# Patient Record
Sex: Female | Born: 1965 | Race: White | Hispanic: No | Marital: Married | Smoking: Never smoker
Health system: Southern US, Community
[De-identification: ages and names within clinical notes are randomized; demographics above are authoritative.]

## PROBLEM LIST (undated history)

## (undated) DIAGNOSIS — A879 Viral meningitis, unspecified: Secondary | ICD-10-CM

## (undated) DIAGNOSIS — J45909 Unspecified asthma, uncomplicated: Secondary | ICD-10-CM

## (undated) HISTORY — DX: Unspecified asthma, uncomplicated: J45.909

## (undated) HISTORY — PX: DILATION AND CURETTAGE OF UTERUS: SHX78

## (undated) HISTORY — DX: Viral meningitis, unspecified: A87.9

---
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
## 1987-07-05 DIAGNOSIS — A879 Viral meningitis, unspecified: Secondary | ICD-10-CM

## 1987-07-05 HISTORY — DX: Viral meningitis, unspecified: A87.9

## 2008-06-05 ENCOUNTER — Ambulatory Visit (HOSPITAL_COMMUNITY): Admission: RE | Admit: 2008-06-05 | Discharge: 2008-06-05 | Payer: Self-pay | Admitting: Family Medicine

## 2010-06-22 ENCOUNTER — Ambulatory Visit (HOSPITAL_COMMUNITY)
Admission: RE | Admit: 2010-06-22 | Discharge: 2010-06-22 | Payer: Self-pay | Source: Home / Self Care | Attending: Family Medicine | Admitting: Family Medicine

## 2018-08-20 ENCOUNTER — Ambulatory Visit
Admission: EM | Admit: 2018-08-20 | Discharge: 2018-08-20 | Disposition: A | Discharge status: Home / Self Care | Payer: Commercial Managed Care - PPO | Attending: Family Medicine | Admitting: Family Medicine

## 2018-08-20 ENCOUNTER — Encounter: Payer: Self-pay | Admitting: Emergency Medicine

## 2018-08-20 DIAGNOSIS — J209 Acute bronchitis, unspecified: Secondary | ICD-10-CM | POA: Diagnosis not present

## 2018-08-20 MED ORDER — BENZONATATE 200 MG PO CAPS: 200.0000 mg | ORAL_CAPSULE | Freq: Three times a day (TID) | ORAL | 0 refills | Status: AC | PRN

## 2018-08-20 MED ORDER — CETIRIZINE HCL 10 MG PO CAPS: 10.0000 mg | ORAL_CAPSULE | Freq: Every day | ORAL | 0 refills | Status: DC

## 2018-08-20 MED ORDER — HYDROCODONE-HOMATROPINE 5-1.5 MG/5ML PO SYRP: 5.0000 mL | ORAL_SOLUTION | Freq: Every day | ORAL | 0 refills | Status: DC

## 2018-08-20 MED ORDER — PREDNISONE 50 MG PO TABS: ORAL_TABLET | ORAL | 0 refills | Status: DC

## 2018-08-20 NOTE — ED Notes (Signed)
Patient able to ambulate independently  

## 2018-08-20 NOTE — Discharge Instructions (Signed)
You likely having a viral upper respiratory infection/bronchitis. We recommended symptom control. I expect your symptoms to start improving in the next 1-2 weeks.  -May fill prednisone if developing significant wheezing and shortness of breath, we will hold off on this for now -Albuterol inhaler as needed for shortness of breath and wheezing  1. Take a daily allergy pill/anti-histamine like Zyrtec, Claritin, or Store brand consistently for 2 weeks  2. For congestion you may try an oral decongestant like Mucinex or sudafed. You may also try intranasal flonase nasal spray or saline irrigations (neti pot, sinus cleanse)  3. For your sore throat you may try cepacol lozenges, salt water gargles, throat spray. Treatment of congestion may also help your sore throat.  4. For cough you may try Hycodan for cough at night time, Tessalon for cough during the day, Delsym, Robitussen, Mucinex DM  5. Take Tylenol or Ibuprofen to help with pain/inflammation  6. Stay hydrated, drink plenty of fluids to keep throat coated and less irritated  Honey Tea For cough/sore throat try using a honey-based tea. Use 3 teaspoons of honey with juice squeezed from half lemon. Place shaved pieces of ginger into 1/2-1 cup of water and warm over stove top. Then mix the ingredients and repeat every 4 hours as needed.

## 2018-08-20 NOTE — ED Triage Notes (Signed)
Pt presents to [facility] for assessment of 5 days of strong productive cough, congestion, headaches, fevers, increasing malaise.

## 2018-08-20 NOTE — ED Provider Notes (Signed)
[facility]    CSN: [id number] Arrival date & time: 08/20/18  1105     History   Chief Complaint Chief Complaint  Patient presents with  . Flu-Like Symptoms    HPI Lindsey Simon is a 53 y.o. female no significant past medical history presenting today for evaluation of URI symptoms and cough.  Patient states that she has had cough, congestion, headaches, fevers.  Fevers have been subjective.  Symptoms have been going on for approximately 5 days.  Her main concern is the cough.  Has noticed some wheezing.  Notes symptoms worse in the morning and evening, but improve during the day.  She has tried NyQuil and elderberry with minimal relief.  She is also noticed some neck discomfort and soreness related to coughing.  She denies chest pain.  HPI  History reviewed. No pertinent past medical history.  There are no active problems to display for this patient.   History reviewed. No pertinent surgical history.  OB History   No obstetric history on file.      Home Medications    Prior to Admission medications   Medication Sig Start Date End Date Taking? Authorizing Provider  Multiple Vitamin (MULTIVITAMIN) tablet Take 1 tablet by mouth daily.   Yes [provider]  benzonatate (TESSALON) 200 MG capsule Take 1 capsule (200 mg total) by mouth 3 (three) times daily as needed for up to 7 days for cough (during the day). 08/20/18 08/27/18  ,  C, PA-C  Cetirizine HCl 10 MG CAPS Take 1 capsule (10 mg total) by mouth daily for 10 days. 08/20/18 08/30/18  ,  C, PA-C  HYDROcodone-homatropine (HYCODAN) 5-1.5 MG/5ML syrup Take 5 mLs by mouth at bedtime. 08/20/18   ,  C, PA-C  predniSONE (DELTASONE) 50 MG tablet Take daily for 5 days with food in the morning 08/20/18   , Junius Creamer C, PA-C    Family History History reviewed. No pertinent family history.  Social History Social History   Tobacco Use  . Smoking status: Never Smoker   . Smokeless tobacco: Never Used  Substance Use Topics  . Alcohol use: Not Currently  . Drug use: Never     Allergies   Prednisone   Review of Systems Review of Systems  Constitutional: Negative for activity change, appetite change, chills, fatigue and fever.  HENT: Positive for congestion and rhinorrhea. Negative for ear pain, sinus pressure, sore throat and trouble swallowing.   Eyes: Negative for discharge and redness.  Respiratory: Positive for cough, shortness of breath and wheezing. Negative for chest tightness.   Cardiovascular: Negative for chest pain.  Gastrointestinal: Negative for abdominal pain, diarrhea, nausea and vomiting.  Musculoskeletal: Negative for myalgias.  Skin: Negative for rash.  Neurological: Positive for headaches. Negative for dizziness and light-headedness.     Physical Exam Triage Vital Signs ED Triage Vitals  Enc Vitals Group     BP 08/20/18 1116 (!) 145/108     Pulse Rate 08/20/18 1116 (!) 105     Resp 08/20/18 1116 18     Temp 08/20/18 1116 99.3 F (37.4 C)     Temp Source 08/20/18 1116 Oral     SpO2 08/20/18 1116 95 %     Weight --      Height --      Head Circumference --      Peak Flow --      Pain Score 08/20/18 1117 4     Pain Loc --  Pain Edu? --      Excl. in GC? --    No data found.  Updated Vital Signs BP (!) 145/108 (BP Location: Left Arm)   Pulse (!) 105   Temp 99.3 F (37.4 C) (Oral)   Resp 18   LMP 08/06/2018   SpO2 95%   Visual Acuity Right Eye Distance:   Left Eye Distance:   Bilateral Distance:    Right Eye Near:   Left Eye Near:    Bilateral Near:     Physical Exam Vitals signs and nursing note reviewed.  Constitutional:      General: She is not in acute distress.    Appearance: She is well-developed.  HENT:     Head: Normocephalic and atraumatic.     Ears:     Comments: Bilateral ears without tenderness to palpation of external auricle, tragus and mastoid, EAC's without erythema or  swelling, TM's with good bony landmarks and cone of light. Non erythematous.    Mouth/Throat:     Comments: Oral mucosa pink and moist, no tonsillar enlargement or exudate. Posterior pharynx patent and nonerythematous, no uvula deviation or swelling. Normal phonation. Eyes:     Conjunctiva/sclera: Conjunctivae normal.  Neck:     Musculoskeletal: Neck supple.  Cardiovascular:     Rate and Rhythm: Normal rate and regular rhythm.     Heart sounds: No murmur.  Pulmonary:     Effort: Pulmonary effort is normal. No respiratory distress.     Breath sounds: Normal breath sounds.     Comments: Breathing comfortably at rest, CTABL, no wheezing, rales or other adventitious sounds auscultated Wheezing with bronchospasm/cough, coughing with deep inspiration Abdominal:     Palpations: Abdomen is soft.     Tenderness: There is no abdominal tenderness.  Skin:    General: Skin is warm and dry.  Neurological:     Mental Status: She is alert.      UC Treatments / Results  Labs (all labs ordered are listed, but only abnormal results are displayed) Labs Reviewed - No data to display  EKG None  Radiology No results found.  Procedures Procedures (including critical care time)  Medications Ordered in UC Medications - No data to display  Initial Impression / Assessment and Plan / UC Course  I have reviewed the triage vital signs and the nursing notes.  Pertinent labs & imaging results that were available during my care of the patient were reviewed by me and considered in my medical decision making (see chart for details).    Patient with viral URI and possibly underlying bronchitis.  Recommend symptomatic and supportive care, provided Hycodan to use at bedtime to help with nighttime cough and rest.  Tessalon during the day.  Discussed course of prednisone, patient states that previously it is made her have insomnia, will avoid this at this time given lungs relatively clear.  Did provide  prescription to fill if developing increased wheezing and inflammation in chest.  Albuterol inhaler as needed, patient had at home already.  Continue to monitor,Discussed strict return precautions. Patient verbalized understanding and is agreeable with plan.  Final Clinical Impressions(s) / UC Diagnoses   Final diagnoses:  Acute bronchitis, unspecified organism     Discharge Instructions     You likely having a viral upper respiratory infection/bronchitis. We recommended symptom control. I expect your symptoms to start improving in the next 1-2 weeks.  -May fill prednisone if developing significant wheezing and shortness of breath, we will hold off  on this for now -Albuterol inhaler as needed for shortness of breath and wheezing  1. Take a daily allergy pill/anti-histamine like Zyrtec, Claritin, or Store brand consistently for 2 weeks  2. For congestion you may try an oral decongestant like Mucinex or sudafed. You may also try intranasal flonase nasal spray or saline irrigations (neti pot, sinus cleanse)  3. For your sore throat you may try cepacol lozenges, salt water gargles, throat spray. Treatment of congestion may also help your sore throat.  4. For cough you may try Hycodan for cough at night time, Tessalon for cough during the day, Delsym, Robitussen, Mucinex DM  5. Take Tylenol or Ibuprofen to help with pain/inflammation  6. Stay hydrated, drink plenty of fluids to keep throat coated and less irritated  Honey Tea For cough/sore throat try using a honey-based tea. Use 3 teaspoons of honey with juice squeezed from half lemon. Place shaved pieces of ginger into 1/2-1 cup of water and warm over stove top. Then mix the ingredients and repeat every 4 hours as needed.   ED Prescriptions    Medication Sig Dispense Auth. Provider   predniSONE (DELTASONE) 50 MG tablet Take daily for 5 days with food in the morning 5 tablet ,  C, PA-C   Cetirizine HCl 10 MG CAPS Take 1  capsule (10 mg total) by mouth daily for 10 days. 10 capsule ,  C, PA-C   HYDROcodone-homatropine (HYCODAN) 5-1.5 MG/5ML syrup Take 5 mLs by mouth at bedtime. 60 mL ,  C, PA-C   benzonatate (TESSALON) 200 MG capsule Take 1 capsule (200 mg total) by mouth 3 (three) times daily as needed for up to 7 days for cough (during the day). 28 capsule ,  C, PA-C     Controlled Substance Prescriptions  Controlled Substance Registry consulted? No   Lew Dawes, [place] 08/20/18 1325

## 2018-09-13 LAB — LIPID PANEL
CHOLESTEROL: 192 (ref 0–200)
HDL: 29 — AB (ref 35–70)
LDL Cholesterol: 92
TRIGLYCERIDES: 354 — AB (ref 40–160)

## 2018-09-13 LAB — BASIC METABOLIC PANEL: GLUCOSE: 210

## 2018-09-25 ENCOUNTER — Telehealth: Payer: Self-pay

## 2018-09-25 NOTE — Telephone Encounter (Signed)
Spoke with the patient. Appointment has been scheduled for a WebEx visit due to attempts to limit possible Covid_19 exposure.

## 2018-09-30 NOTE — Progress Notes (Signed)
Virtual Visit via Video Note  I connected with@ on 10/01/18 at 10:00 AM EDT by a video enabled telemedicine application and verified that I am speaking with the correct person using two identifiers.  Location patient: home Location provider:work or home office Persons participating in the virtual visit: patient, provider  I discussed the limitations of evaluation and management by telemedicine and the availability of in person appointments. The patient expressed understanding and agreed to proceed.   Lindsey Simon DOB: Feb 11, 1966 Encounter date: 10/01/2018  This is a 53 y.o. female who presents to establish care. Chief Complaint  Patient presents with  . New Patient (Initial Visit)    History of present illness: Works for [employer] and has biometric screenings. Blood pressure reading came through as high on her screening.  Highly stressful job. 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 BP has been creeping over the years. Exercise more focused on the weekends than on week days. Has been doing outdoor yard work. Does a lot of farming/gardening/demo type work. Just bought new property in [place].   Essential tremor - dx at age 69. All below neck. Knows how to manage it. Closing eyes makes it worse; opening them makes it better. Sugar, caffeine make it worse. Healthy eating improves.   Asthma: has been able to ward off colds with nebulizer tx. Does feel like infections tend to settle in chest.   Pickled beets when she has heart burn.   Does not have OBGYN; last primary was obgyn. Last pap she thinks was 5 years ago. No hx of abnormal paps.   Has home bp cuff but hasn't been checking regularly. Plans to start. Thinks usually systolic is in the 140's; unsure what diastolic usually runs.   Past Medical History:  Diagnosis Date  . Asthma   . Viral meningitis 1989   Past Surgical History:  Procedure Laterality Date  . DILATION AND CURETTAGE OF UTERUS    .  TONSILLECTOMY AND ADENOIDECTOMY  1982   Allergies  Allergen Reactions  . Calamine   . Prednisone Other (See Comments)    Tachycardia   Current Meds  Medication Sig  . Multiple Vitamin (MULTIVITAMIN) tablet Take 1 tablet by mouth daily.  . [DISCONTINUED] HYDROcodone-homatropine (HYCODAN) 5-1.5 MG/5ML syrup Take 5 mLs by mouth at bedtime.  . [DISCONTINUED] predniSONE (DELTASONE) 50 MG tablet Take daily for 5 days with food in the morning   Social History   Tobacco Use  . Smoking status: Never Smoker  . Smokeless tobacco: Never Used  Substance Use Topics  . Alcohol use: Yes    Comment: social, occasional   Family History  Adopted: Yes  Problem Relation Age of Onset  . Kidney disease Mother        when younger  . Other Father        essential tremor  . Heart disease Maternal Grandmother   . Other Maternal Grandfather 19       no significant medical issues     Review of Systems  Constitutional: Negative for activity change and fever.  HENT: Positive for congestion (seasonal allergies).   Respiratory: Negative for cough and shortness of breath.   Cardiovascular:       Blood pressure has been creeping up over the years.  Gastrointestinal: Negative for anal bleeding and blood in stool.  Endocrine:       Feels that she is having low blood sugars randomly; has to snack throughout day.   Psychiatric/Behavioral: Nervous/anxious: increased  stress at work.     Objective:  BP (!) 148/98   Ht 5\' 6"  (1.676 m)   Wt 253 lb (114.8 kg)   BMI 40.84 kg/m   Weight: 253 lb (114.8 kg)   BP Readings from Last 3 Encounters:  10/01/18 (!) 148/98  08/20/18 (!) 145/108   Wt Readings from Last 3 Encounters:  10/01/18 253 lb (114.8 kg)    EXAM: Although we had a connection via audio, the video was not working.  GENERAL: alert, oriented, appears well and in no acute distress  LUNGS: No obvious shortness of breath  MS: moves all visible extremities without noticeable  abnormality  PSYCH/NEURO: pleasant and cooperative, no obvious depression or anxiety, speech and thought processing grossly intact  Assessment/Plan  1. Hypertension, unspecified type We discussed possible need for blood pressure treatment with medications.  She is going to monitor her blood pressure with her home cuff and let me know how readings at home left over the next couple of days.  We will touch base with her when we get blood work results to see how home numbers are looking.  She has some interest in starting a beta-blocker for blood pressure control as this may also benefit her essential tremor.  There have been discussions in the past about using this for better control of her essential tremor, but she has not wanted to previously be on medications if possible.  We also discussed limiting salt intake in the diet, which may make a difference for blood pressure.  She is in the habit of drinking pickled beet juice which we discussed may be significantly elevating her blood pressure. - Comprehensive metabolic panel; Future - CBC with Differential/Platelet; Future  2. Essential tremor This is stable and does not bother her too much.  However, if she is started on blood pressure medication she would like to try a beta-blocker for better control of tremor.  3. Hyperlipidemia, unspecified hyperlipidemia type We discussed working on diet to help with cholesterol. - TSH; Future  4. Elevated fasting glucose - Hemoglobin A1c; Future  5. Mild intermittent asthma, unspecified whether complicated Well controlled off of all medications currently.  - albuterol (PROVENTIL HFA;VENTOLIN HFA) 108 (90 Base) MCG/ACT inhaler; Inhale 2 puffs into the lungs every 6 (six) hours as needed for wheezing or shortness of breath.  Dispense: 1 Inhaler; Refill: 0  6. Screening for colon cancer - Ambulatory referral to Gastroenterology  7. Screening for breast cancer - MM DIGITAL SCREENING BILATERAL;  Future     She is due for Tdap, pap, mammogram (ordered), colonoscopy (ordered)  I discussed the assessment and treatment plan with the patient. The patient was provided an opportunity to ask questions and all were answered. The patient agreed with the plan and demonstrated an understanding of the instructions.   The patient was advised to call back or seek an in-person evaluation if the symptoms worsen or if the condition fails to improve as anticipated.    Theodis Shove, MD

## 2018-10-01 ENCOUNTER — Other Ambulatory Visit: Payer: Self-pay

## 2018-10-01 ENCOUNTER — Ambulatory Visit (INDEPENDENT_AMBULATORY_CARE_PROVIDER_SITE_OTHER): Payer: Commercial Managed Care - PPO | Admitting: Family Medicine

## 2018-10-01 ENCOUNTER — Encounter: Payer: Self-pay | Admitting: Family Medicine

## 2018-10-01 ENCOUNTER — Ambulatory Visit: Payer: Commercial Managed Care - PPO | Admitting: Family Medicine

## 2018-10-01 VITALS — BP 148/98 | Ht 66.0 in | Wt 253.0 lb

## 2018-10-01 DIAGNOSIS — I10 Essential (primary) hypertension: Secondary | ICD-10-CM

## 2018-10-01 DIAGNOSIS — E1165 Type 2 diabetes mellitus with hyperglycemia: Secondary | ICD-10-CM | POA: Insufficient documentation

## 2018-10-01 DIAGNOSIS — IMO0002 Reserved for concepts with insufficient information to code with codable children: Secondary | ICD-10-CM | POA: Insufficient documentation

## 2018-10-01 DIAGNOSIS — G250 Essential tremor: Secondary | ICD-10-CM

## 2018-10-01 DIAGNOSIS — E785 Hyperlipidemia, unspecified: Secondary | ICD-10-CM

## 2018-10-01 DIAGNOSIS — Z1211 Encounter for screening for malignant neoplasm of colon: Secondary | ICD-10-CM

## 2018-10-01 DIAGNOSIS — R7301 Impaired fasting glucose: Secondary | ICD-10-CM

## 2018-10-01 DIAGNOSIS — J4520 Mild intermittent asthma, uncomplicated: Secondary | ICD-10-CM

## 2018-10-01 DIAGNOSIS — Z1239 Encounter for other screening for malignant neoplasm of breast: Secondary | ICD-10-CM

## 2018-10-01 DIAGNOSIS — J45909 Unspecified asthma, uncomplicated: Secondary | ICD-10-CM | POA: Insufficient documentation

## 2018-10-01 MED ORDER — ALBUTEROL SULFATE HFA 108 (90 BASE) MCG/ACT IN AERS: 2.0000 | INHALATION_SPRAY | Freq: Four times a day (QID) | RESPIRATORY_TRACT | 0 refills | Status: AC | PRN

## 2018-10-02 ENCOUNTER — Other Ambulatory Visit (INDEPENDENT_AMBULATORY_CARE_PROVIDER_SITE_OTHER): Payer: Commercial Managed Care - PPO

## 2018-10-02 ENCOUNTER — Other Ambulatory Visit: Payer: Self-pay

## 2018-10-02 DIAGNOSIS — E785 Hyperlipidemia, unspecified: Secondary | ICD-10-CM

## 2018-10-02 DIAGNOSIS — I10 Essential (primary) hypertension: Secondary | ICD-10-CM

## 2018-10-02 DIAGNOSIS — R7301 Impaired fasting glucose: Secondary | ICD-10-CM | POA: Diagnosis not present

## 2018-10-02 LAB — CBC WITH DIFFERENTIAL/PLATELET
BASOS PCT: 0.5 % (ref 0.0–3.0)
Basophils Absolute: 0 10*3/uL (ref 0.0–0.1)
EOS ABS: 0.1 10*3/uL (ref 0.0–0.7)
Eosinophils Relative: 1.8 % (ref 0.0–5.0)
HEMATOCRIT: 46.8 % — AB (ref 36.0–46.0)
HEMOGLOBIN: 16.1 g/dL — AB (ref 12.0–15.0)
LYMPHS PCT: 32.7 % (ref 12.0–46.0)
Lymphs Abs: 2.2 10*3/uL (ref 0.7–4.0)
MCHC: 34.4 g/dL (ref 30.0–36.0)
MCV: 88 fl (ref 78.0–100.0)
Monocytes Absolute: 0.4 10*3/uL (ref 0.1–1.0)
Monocytes Relative: 6.5 % (ref 3.0–12.0)
Neutro Abs: 4 10*3/uL (ref 1.4–7.7)
Neutrophils Relative %: 58.5 % (ref 43.0–77.0)
Platelets: 213 10*3/uL (ref 150.0–400.0)
RBC: 5.32 Mil/uL — AB (ref 3.87–5.11)
RDW: 13.7 % (ref 11.5–15.5)
WBC: 6.8 10*3/uL (ref 4.0–10.5)

## 2018-10-02 LAB — COMPREHENSIVE METABOLIC PANEL
ALBUMIN: 4 g/dL (ref 3.5–5.2)
ALT: 24 U/L (ref 0–35)
AST: 20 U/L (ref 0–37)
Alkaline Phosphatase: 69 U/L (ref 39–117)
BILIRUBIN TOTAL: 0.5 mg/dL (ref 0.2–1.2)
BUN: 7 mg/dL (ref 6–23)
CALCIUM: 9 mg/dL (ref 8.4–10.5)
CHLORIDE: 101 meq/L (ref 96–112)
CO2: 28 mEq/L (ref 19–32)
CREATININE: 0.66 mg/dL (ref 0.40–1.20)
GFR: 94.56 mL/min (ref >60.00)
Glucose, Bld: 195 mg/dL — ABNORMAL HIGH (ref 70–99)
Potassium: 4 mEq/L (ref 3.5–5.1)
Sodium: 137 mEq/L (ref 135–145)
TOTAL PROTEIN: 7.1 g/dL (ref 6.0–8.3)

## 2018-10-02 LAB — TSH: TSH: 2.23 u[IU]/mL (ref 0.35–4.50)

## 2018-10-02 LAB — HEMOGLOBIN A1C: Hgb A1c MFr Bld: 10 % — ABNORMAL HIGH (ref 4.6–6.5)

## 2018-10-04 NOTE — Progress Notes (Signed)
Virtual Visit via Video Note  I connected with@ on 10/05/18 at 11:00 AM EDT by a video enabled telemedicine application and verified that I am speaking with the correct person using two identifiers.  Location patient: home Location provider:work or home office Persons participating in the virtual visit: patient, provider  I discussed the limitations of evaluation and management by telemedicine and the availability of in person appointments. The patient expressed understanding and agreed to proceed.   Lindsey Simon DOB: 02-Jan-1966 Encounter date: 10/05/2018  This is a 53 y.o. female who presents with Chief Complaint  Patient presents with  . Follow-up   Video feed not working on patient end; visit completed by telephone (patient still could see provider)  History of present illness: Hasn't been able to check blood pressures since we last talked. Husband is diabetic, but not following diabetic diet. They eat out frequently and eat a lot of fast food.  Lipid Panel     Component Value Date/Time   CHOL 192 09/13/2018   TRIG 354 (A) 09/13/2018   HDL 29 (A) 09/13/2018   LDLCALC 92 09/13/2018   A1C: 10  She has had busy week. 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 This has added to stress of week.   She also was contacted this week by birth father.   family history was updated (although she forgot to ask about his other family members)  Allergies  Allergen Reactions  . Calamine   . Prednisone Other (See Comments)    Tachycardia   Current Meds  Medication Sig  . albuterol (PROVENTIL HFA;VENTOLIN HFA) 108 (90 Base) MCG/ACT inhaler Inhale 2 puffs into the lungs every 6 (six) hours as needed for wheezing or shortness of breath.  . Multiple Vitamin (MULTIVITAMIN) tablet Take 1 tablet by mouth daily.    Review of Systems  Constitutional: Negative for chills, fatigue and fever.  Respiratory: Negative for cough, chest tightness, shortness of breath and wheezing.    Cardiovascular: Negative for chest pain, palpitations and leg swelling.    Objective:  There were no vitals taken for this visit.      BP Readings from Last 3 Encounters:  10/01/18 (!) 148/98  08/20/18 (!) 145/108   Wt Readings from Last 3 Encounters:  10/01/18 253 lb (114.8 kg)    EXAM: Video not working on patient end;  GENERAL: alert, oriented, appears well and in no acute distress LUNGS:breathing normal; not short of breath PSYCH/NEURO: pleasant and cooperative, no obvious depression or anxiety, speech and thought processing grossly intact   Assessment/Plan  1. Hypertension, unspecified type We are not sure how numbers are running since we have not seen her in office and she hasn't been able to check at home. From previous readings she appears to run high.  Discussed new medication(s) today with patient. Discussed potential side effects and patient verbalized understanding.she will report these to me in 1-2 weeks.  - lisinopril (PRINIVIL,ZESTRIL) 10 MG tablet; Take 1 tablet (10 mg total) by mouth daily.  Dispense: 90 tablet; Refill: 1  2. Hyperlipidemia, unspecified hyperlipidemia type Her LDL is controlled. HDL needs improvement. We discussed importance of exercise for this today. We will plan to add statin due to dx of diabetes, but I am going to hold off adding until we get her maximized with blood pressure and diabetic medications so we are not adding too many new meds at once.   3. Uncontrolled type 2 diabetes mellitus with hyperglycemia ([place]) Discussed importance of  carbohydrate counting. Reviewed diabetes diagnosis and working on treating insulin resistance. Reviewed importance of exercise for glucose control. Discussed she will likely need second medication added, but since she admits to poor eating and no exercise, we discussed how she can have significant impact on her numbers with lifestyle changes. She will check sugars daily to BID and report back in 1-2 weeks.  Additional med pending this. Discussed some options and jardiance was of interest. - blood glucose meter kit and supplies KIT; Dispense based on patient and insurance preference. Use BID prn.  Dispense: 1 each; Refill: 0 - metFORMIN (GLUCOPHAGE-XR) 500 MG 24 hr tablet; Take 1 tablet (500 mg total) by mouth daily with breakfast.  Dispense: 90 tablet; Refill: 1     I discussed the assessment and treatment plan with the patient. The patient was provided an opportunity to ask questions and all were answered. The patient agreed with the plan and demonstrated an understanding of the instructions.   The patient was advised to call back or seek an in-person evaluation if the symptoms worsen or if the condition fails to improve as anticipated.  25 minutes of visit spent talking with patient about diabetic treatment options and importance of diet/exercise with current diagnoses. We reviewed carbohydrate counting, nonstarchy vegetable, importance of portion control, healthy mindset.  Micheline Rough, MD

## 2018-10-05 ENCOUNTER — Ambulatory Visit (INDEPENDENT_AMBULATORY_CARE_PROVIDER_SITE_OTHER): Payer: Commercial Managed Care - PPO | Admitting: Family Medicine

## 2018-10-05 ENCOUNTER — Other Ambulatory Visit: Payer: Self-pay

## 2018-10-05 ENCOUNTER — Encounter: Payer: Self-pay | Admitting: Family Medicine

## 2018-10-05 DIAGNOSIS — I10 Essential (primary) hypertension: Secondary | ICD-10-CM

## 2018-10-05 DIAGNOSIS — E1165 Type 2 diabetes mellitus with hyperglycemia: Secondary | ICD-10-CM | POA: Diagnosis not present

## 2018-10-05 DIAGNOSIS — E785 Hyperlipidemia, unspecified: Secondary | ICD-10-CM

## 2018-10-05 MED ORDER — LISINOPRIL 10 MG PO TABS
10.0000 mg | ORAL_TABLET | Freq: Every day | ORAL | 1 refills | Status: DC
Start: 2018-10-05 — End: 2019-04-08

## 2018-10-05 MED ORDER — METFORMIN HCL ER 500 MG PO TB24: 500.0000 mg | ORAL_TABLET | Freq: Every day | ORAL | 1 refills | Status: DC

## 2018-10-05 MED ORDER — BLOOD GLUCOSE MONITOR KIT: PACK | 0 refills | Status: DC

## 2018-10-19 ENCOUNTER — Encounter: Payer: Self-pay | Admitting: Family Medicine

## 2018-11-19 ENCOUNTER — Encounter: Payer: Self-pay | Admitting: Family Medicine

## 2018-11-21 ENCOUNTER — Telehealth: Payer: Self-pay | Admitting: Family Medicine

## 2018-11-21 NOTE — Telephone Encounter (Signed)
Patient states that during her visit with Dr Hassan Rowan she requested patient to come in to get a pap smear done, that she needed her to do this due to a circumstance.  There are no physical spots until 03/13/19, so I think she may need an in office OV for this.  She stated she also needs a pneumonia and tetanus vaccine.  I told her I would have to check with you and that you would have to let her know when she could make and appt.

## 2018-11-21 NOTE — Telephone Encounter (Signed)
I called the pt and scheduled an appt for a physical on 6/10.

## 2018-12-04 ENCOUNTER — Ambulatory Visit: Payer: Commercial Managed Care - PPO

## 2018-12-10 ENCOUNTER — Encounter: Payer: Self-pay | Admitting: Gastroenterology

## 2018-12-12 ENCOUNTER — Ambulatory Visit (INDEPENDENT_AMBULATORY_CARE_PROVIDER_SITE_OTHER): Payer: Commercial Managed Care - PPO | Admitting: Family Medicine

## 2018-12-12 ENCOUNTER — Other Ambulatory Visit: Payer: Self-pay

## 2018-12-12 ENCOUNTER — Encounter: Payer: Self-pay | Admitting: Family Medicine

## 2018-12-12 ENCOUNTER — Other Ambulatory Visit (HOSPITAL_COMMUNITY)
Admission: RE | Admit: 2018-12-12 | Discharge: 2018-12-12 | Disposition: A | Discharge status: Home / Self Care | Payer: Commercial Managed Care - PPO | Source: Ambulatory Visit | Attending: Family Medicine | Admitting: Family Medicine

## 2018-12-12 VITALS — BP 116/72 | HR 87 | Temp 98.1°F | Ht 65.75 in | Wt 251.8 lb

## 2018-12-12 DIAGNOSIS — E1165 Type 2 diabetes mellitus with hyperglycemia: Secondary | ICD-10-CM

## 2018-12-12 DIAGNOSIS — Z0000 Encounter for general adult medical examination without abnormal findings: Secondary | ICD-10-CM | POA: Diagnosis not present

## 2018-12-12 DIAGNOSIS — E785 Hyperlipidemia, unspecified: Secondary | ICD-10-CM

## 2018-12-12 DIAGNOSIS — I10 Essential (primary) hypertension: Secondary | ICD-10-CM | POA: Diagnosis not present

## 2018-12-12 DIAGNOSIS — R0683 Snoring: Secondary | ICD-10-CM

## 2018-12-12 DIAGNOSIS — Z23 Encounter for immunization: Secondary | ICD-10-CM

## 2018-12-12 NOTE — Progress Notes (Addendum)
Lindsey Simon DOB: 08-28-1965 Encounter date: 12/12/2018  This is a 53 y.o. female who presents for complete physical   History of present illness/Additional concerns: Established care during [facility] so this is first in person evaluation.  HTN: has been doing well with lisinopril.  HL: discussed importance of exercise. Discussed adding statin, but we were waiting for bp and sugar control. Newly dx diabetes: discussed carb counting during last visit. Started metformin 53m daily and asked to check sugars bid.   Things have been back to quiet. Has been doing better with blood sugar and blood pressures. Sugars have been running between 115-135. This week hasn't checked them. Has been building barn and up very early and working through day. Eating more poorly this week than typical.   Has programs through work that come with coach. Patient tracks meals and then coach follows up with this.   Hasn't been doing "exercise" but has been out doing work with building barn. More physical work than typical for her. If she does housework (cleaning/chores) at home she does note that sugars are better in the morning.   Has mammogram and colonoscopy scheduled in the next month.   Past Medical History:  Diagnosis Date  . Asthma   . Viral meningitis 1989   Past Surgical History:  Procedure Laterality Date  . DILATION AND CURETTAGE OF UTERUS    . TONSILLECTOMY AND ADENOIDECTOMY  1982   Allergies  Allergen Reactions  . Calamine   . Prednisone Other (See Comments)    Tachycardia   Current Meds  Medication Sig  . albuterol (PROVENTIL HFA;VENTOLIN HFA) 108 (90 Base) MCG/ACT inhaler Inhale 2 puffs into the lungs every 6 (six) hours as needed for wheezing or shortness of breath.  . blood glucose meter kit and supplies KIT Dispense based on patient and insurance preference. Use BID prn.  . lisinopril (PRINIVIL,ZESTRIL) 10 MG tablet Take 1 tablet (10 mg total) by mouth daily.  . metFORMIN  (GLUCOPHAGE-XR) 500 MG 24 hr tablet Take 1 tablet (500 mg total) by mouth daily with breakfast.  . Multiple Vitamin (MULTIVITAMIN) tablet Take 1 tablet by mouth daily.   Social History   Tobacco Use  . Smoking status: Never Smoker  . Smokeless tobacco: Never Used  Substance Use Topics  . Alcohol use: Yes    Comment: social, occasional   Family History  Adopted: Yes  Problem Relation Age of Onset  . Kidney disease Mother        when younger  . Other Father        essential tremor  . Charcot-Marie-Tooth disease Father   . Heart disease Maternal Grandmother   . Other Maternal Grandfather 92       no significant medical issues     Review of Systems  Constitutional: Negative for activity change, appetite change, chills, fatigue, fever and unexpected weight change.  HENT: Negative for congestion, ear pain, hearing loss, sinus pressure, sinus pain, sore throat and trouble swallowing.   Eyes: Negative for pain and visual disturbance.  Respiratory: Negative for cough, chest tightness, shortness of breath and wheezing.        Snore.  She has for years.  Husband that she stops breathing during sleep.  She denies daytime fatigue and feels well rested in the morning.  Cardiovascular: Negative for chest pain, palpitations and leg swelling.  Gastrointestinal: Negative for abdominal pain, blood in stool, constipation, diarrhea, nausea and vomiting.  Genitourinary: Negative for difficulty urinating and menstrual problem.  Musculoskeletal:  Negative for arthralgias and back pain.  Skin: Negative for rash.  Neurological: Negative for dizziness, weakness, numbness and headaches.  Hematological: Negative for adenopathy. Does not bruise/bleed easily.  Psychiatric/Behavioral: Negative for sleep disturbance and suicidal ideas. The patient is not nervous/anxious.     CBC:  Lab Results  Component Value Date   WBC 6.8 10/02/2018   HGB 16.1 (H) 10/02/2018   HCT 46.8 (H) 10/02/2018   MCHC 34.4  10/02/2018   RDW 13.7 10/02/2018   PLT 213.0 10/02/2018   CMP: Lab Results  Component Value Date   NA 137 10/02/2018   K 4.0 10/02/2018   CL 101 10/02/2018   CO2 28 10/02/2018   GLUCOSE 195 (H) 10/02/2018   BUN 7 10/02/2018   CREATININE 0.66 10/02/2018   CALCIUM 9.0 10/02/2018   PROT 7.1 10/02/2018   BILITOT 0.5 10/02/2018   ALKPHOS 69 10/02/2018   ALT 24 10/02/2018   AST 20 10/02/2018   LIPID: Lab Results  Component Value Date   CHOL 192 09/13/2018   TRIG 354 (A) 09/13/2018   HDL 29 (A) 09/13/2018   LDLCALC 92 09/13/2018    Objective:  BP 116/72 (BP Location: Left Arm, Patient Position: Sitting, Cuff Size: Large)   Pulse 87   Temp 98.1 F (36.7 C) (Oral)   Ht 5' 5.75" (1.67 m)   Wt 251 lb 12.8 oz (114.2 kg)   LMP 11/16/2018 (Exact Date)   SpO2 98%   BMI 40.95 kg/m   Weight: 251 lb 12.8 oz (114.2 kg)   BP Readings from Last 3 Encounters:  12/12/18 116/72  10/01/18 (!) 148/98  08/20/18 (!) 145/108   Wt Readings from Last 3 Encounters:  12/12/18 251 lb 12.8 oz (114.2 kg)  10/01/18 253 lb (114.8 kg)    Physical Exam Exam conducted with a chaperone present.  Constitutional:      General: She is not in acute distress.    Appearance: She is well-developed.  HENT:     Head: Normocephalic and atraumatic.     Right Ear: External ear normal.     Left Ear: External ear normal.     Mouth/Throat:     Pharynx: No oropharyngeal exudate.  Eyes:     Conjunctiva/sclera: Conjunctivae normal.     Pupils: Pupils are equal, round, and reactive to light.  Neck:     Musculoskeletal: Normal range of motion and neck supple.     Thyroid: No thyromegaly.  Cardiovascular:     Rate and Rhythm: Normal rate and regular rhythm.     Heart sounds: Normal heart sounds. No murmur. No friction rub. No gallop.   Pulmonary:     Effort: Pulmonary effort is normal.     Breath sounds: Normal breath sounds.  Abdominal:     General: Bowel sounds are normal. There is no distension.      Palpations: Abdomen is soft. There is no mass.     Tenderness: There is no abdominal tenderness. There is no guarding.     Hernia: No hernia is present.  Genitourinary:    Exam position: Supine.     Labia:        Right: No rash or tenderness.        Left: No rash or tenderness.      Vagina: Normal.     Cervix: Normal.     Uterus: Normal.      Adnexa: Right adnexa normal and left adnexa normal.  Musculoskeletal: Normal range of motion.  General: No tenderness or deformity.  Lymphadenopathy:     Cervical: No cervical adenopathy.  Skin:    General: Skin is warm and dry.     Findings: No rash.  Neurological:     Mental Status: She is alert and oriented to person, place, and time.     Deep Tendon Reflexes: Reflexes normal.     Reflex Scores:      Tricep reflexes are 2+ on the right side and 2+ on the left side.      Bicep reflexes are 2+ on the right side and 2+ on the left side.      Brachioradialis reflexes are 2+ on the right side and 2+ on the left side.      Patellar reflexes are 2+ on the right side and 2+ on the left side. Psychiatric:        Speech: Speech normal.        Behavior: Behavior normal.        Thought Content: Thought content normal.     Assessment/Plan: Health Maintenance Due  Topic Date Due  . PNEUMOCOCCAL POLYSACCHARIDE VACCINE AGE 41-64 HIGH RISK  02/16/1970  . FOOT EXAM  02/16/1978  . TETANUS/TDAP  02/17/1987  . PAP SMEAR-Modifier  02/16/1989  . MAMMOGRAM  02/16/2018  . COLONOSCOPY  02/16/2018   Health Maintenance reviewed -colonoscopy, mammogram are pending.  Prevnar given in office today.  Tdap given in office today.  1. Preventative health care See above.  Discussed importance of keeping up with physical activity on a daily basis and healthy eating. - Cytology - PAP  2. Uncontrolled type 2 diabetes mellitus with hyperglycemia ([place]) Sugars seem to be fairly well controlled at home.  We will recheck A1c in July.  Further medication or  lifestyle adjustments pending this result. - Hemoglobin A1c; Future - Microalbumin / creatinine urine ratio; Future  3. Hypertension, unspecified type Well-controlled.  Continue lisinopril.  4. Hyperlipidemia, unspecified hyperlipidemia type Although we did previously discuss statin, she has made significant lifestyle changes, which should improve her cholesterol overall.  We will plan to recheck cholesterol in about 3 months time.  If no improvement in HDL or LDL at that time, would add statin.  She is in agreement with this.  5. Need for Tdap vaccination  - Tdap vaccine greater than or equal to 7yo IM  6. Need for pneumococcal vaccination  - Pneumococcal conjugate vaccine 13-valent IM  7.  Snoring: we discussed this today. Would be ok with sleep study if recommended.  We will first complete mammogram, colonoscopy.  We will see how blood work looks in a few months.  If she is not successful with weight loss/improvement in snoring, I would suggest sleep study for further evaluation.  Return labwork in July; [place] in fall pending July results.  Micheline Rough, MD

## 2018-12-12 NOTE — Patient Instructions (Signed)
Keep up with healthier eating and keep working on fitting in daily exercise!  Schedule bloodwork visit in July - any time after first week.   Follow up will be pending these results.

## 2018-12-17 LAB — CYTOLOGY - PAP
Diagnosis: NEGATIVE
HPV: NOT DETECTED

## 2018-12-29 ENCOUNTER — Ambulatory Visit
Admission: RE | Admit: 2018-12-29 | Discharge: 2018-12-29 | Disposition: A | Discharge status: Home / Self Care | Payer: Commercial Managed Care - PPO | Source: Ambulatory Visit | Attending: Family Medicine | Admitting: Family Medicine

## 2018-12-29 ENCOUNTER — Other Ambulatory Visit: Payer: Self-pay

## 2018-12-29 DIAGNOSIS — Z1239 Encounter for other screening for malignant neoplasm of breast: Secondary | ICD-10-CM

## 2019-01-01 ENCOUNTER — Other Ambulatory Visit: Payer: Self-pay | Admitting: Family Medicine

## 2019-01-01 DIAGNOSIS — R928 Other abnormal and inconclusive findings on diagnostic imaging of breast: Secondary | ICD-10-CM

## 2019-01-03 ENCOUNTER — Telehealth: Payer: Self-pay | Admitting: Family Medicine

## 2019-01-03 NOTE — Telephone Encounter (Signed)
Cherish is calling from [facility] faxed a referral for the patient for an abnormal mamogram. Patient will be returning on Monday.  Is it possible the the referral can be faxed back please Fax- [phone]

## 2019-01-07 ENCOUNTER — Ambulatory Visit
Admission: RE | Admit: 2019-01-07 | Discharge: 2019-01-07 | Disposition: A | Discharge status: Home / Self Care | Payer: Commercial Managed Care - PPO | Source: Ambulatory Visit | Attending: Family Medicine | Admitting: Family Medicine

## 2019-01-07 ENCOUNTER — Other Ambulatory Visit: Payer: Commercial Managed Care - PPO

## 2019-01-07 ENCOUNTER — Other Ambulatory Visit: Payer: Self-pay | Admitting: Family Medicine

## 2019-01-07 ENCOUNTER — Other Ambulatory Visit: Payer: Self-pay

## 2019-01-07 DIAGNOSIS — R928 Other abnormal and inconclusive findings on diagnostic imaging of breast: Secondary | ICD-10-CM

## 2019-01-07 DIAGNOSIS — N6489 Other specified disorders of breast: Secondary | ICD-10-CM

## 2019-01-09 ENCOUNTER — Encounter: Payer: Commercial Managed Care - PPO | Admitting: Gastroenterology

## 2019-01-10 ENCOUNTER — Other Ambulatory Visit: Payer: Self-pay | Admitting: Family Medicine

## 2019-01-10 ENCOUNTER — Other Ambulatory Visit: Payer: Self-pay | Admitting: *Deleted

## 2019-04-06 ENCOUNTER — Other Ambulatory Visit: Payer: Self-pay | Admitting: Family Medicine

## 2019-04-06 DIAGNOSIS — E1165 Type 2 diabetes mellitus with hyperglycemia: Secondary | ICD-10-CM

## 2019-04-06 DIAGNOSIS — I10 Essential (primary) hypertension: Secondary | ICD-10-CM

## 2019-06-11 ENCOUNTER — Other Ambulatory Visit: Payer: Self-pay | Admitting: Family Medicine

## 2019-06-11 DIAGNOSIS — E1165 Type 2 diabetes mellitus with hyperglycemia: Secondary | ICD-10-CM

## 2019-06-11 DIAGNOSIS — I10 Essential (primary) hypertension: Secondary | ICD-10-CM

## 2019-07-11 ENCOUNTER — Other Ambulatory Visit: Payer: Self-pay

## 2019-07-11 ENCOUNTER — Ambulatory Visit
Admission: RE | Admit: 2019-07-11 | Discharge: 2019-07-11 | Disposition: A | Discharge status: Home / Self Care | Payer: Commercial Managed Care - PPO | Source: Ambulatory Visit | Attending: Family Medicine | Admitting: Family Medicine

## 2019-07-11 ENCOUNTER — Other Ambulatory Visit: Payer: Self-pay | Admitting: Family Medicine

## 2019-07-11 DIAGNOSIS — N6489 Other specified disorders of breast: Secondary | ICD-10-CM

## 2019-07-12 ENCOUNTER — Ambulatory Visit: Payer: Commercial Managed Care - PPO | Attending: Internal Medicine

## 2019-07-12 ENCOUNTER — Other Ambulatory Visit: Payer: Commercial Managed Care - PPO

## 2019-07-12 DIAGNOSIS — Z20822 Contact with and (suspected) exposure to covid-19: Secondary | ICD-10-CM

## 2019-07-14 LAB — NOVEL CORONAVIRUS, NAA: SARS-CoV-2, NAA: NOT DETECTED

## 2019-07-19 ENCOUNTER — Ambulatory Visit: Payer: Commercial Managed Care - PPO | Attending: Internal Medicine

## 2019-07-19 DIAGNOSIS — Z20822 Contact with and (suspected) exposure to covid-19: Secondary | ICD-10-CM

## 2019-07-20 LAB — NOVEL CORONAVIRUS, NAA: SARS-CoV-2, NAA: DETECTED — AB

## 2019-11-11 ENCOUNTER — Encounter: Payer: Self-pay | Admitting: Family Medicine

## 2019-12-05 ENCOUNTER — Encounter: Payer: Self-pay | Admitting: Family Medicine

## 2019-12-11 ENCOUNTER — Ambulatory Visit: Payer: Commercial Managed Care - PPO | Admitting: Family Medicine

## 2019-12-20 ENCOUNTER — Ambulatory Visit: Payer: Commercial Managed Care - PPO | Admitting: Family Medicine

## 2020-01-09 ENCOUNTER — Ambulatory Visit
Admission: RE | Admit: 2020-01-09 | Discharge: 2020-01-09 | Disposition: A | Discharge status: Home / Self Care | Payer: Commercial Managed Care - PPO | Source: Ambulatory Visit | Attending: Family Medicine | Admitting: Family Medicine

## 2020-01-09 ENCOUNTER — Other Ambulatory Visit: Payer: Self-pay

## 2020-01-09 ENCOUNTER — Other Ambulatory Visit: Payer: Self-pay | Admitting: Family Medicine

## 2020-01-09 DIAGNOSIS — N6489 Other specified disorders of breast: Secondary | ICD-10-CM

## 2020-01-27 ENCOUNTER — Telehealth: Payer: Commercial Managed Care - PPO | Admitting: Family

## 2020-01-27 DIAGNOSIS — R399 Unspecified symptoms and signs involving the genitourinary system: Secondary | ICD-10-CM

## 2020-01-27 MED ORDER — CEPHALEXIN 500 MG PO CAPS: 500.0000 mg | ORAL_CAPSULE | Freq: Two times a day (BID) | ORAL | 0 refills | Status: DC

## 2020-01-27 NOTE — Progress Notes (Signed)

## 2020-02-04 ENCOUNTER — Telehealth: Payer: Commercial Managed Care - PPO | Admitting: Family

## 2020-02-04 ENCOUNTER — Encounter: Payer: Self-pay | Admitting: Family Medicine

## 2020-02-04 ENCOUNTER — Telehealth: Payer: Self-pay | Admitting: Family Medicine

## 2020-02-04 DIAGNOSIS — R300 Dysuria: Secondary | ICD-10-CM

## 2020-02-04 NOTE — Telephone Encounter (Signed)
Pt said she is still having itching and burning and would like another prescription for antibiotics. She has completed the first dose.  Please advise

## 2020-02-04 NOTE — Progress Notes (Signed)
Based on what you shared with me, I feel your condition warrants further evaluation and I recommend that you be seen for a face to face visit.  Please contact your primary care physician practice to be seen. Many offices offer virtual options to be seen via video if you are not comfortable going in person to a medical facility at this time.  We need to obtain a urine sample and see why the infection did not go away.   If you do not have a PCP, [facility] offers a free physician referral service available at [phone]. Our trained staff has the experience, knowledge and resources to put you in touch with a physician who is right for you.   You also have the option of a video visit through [email or web address]  If you are having a true medical emergency please call 911.  NOTE: If you entered your credit card information for this eVisit, you will not be charged. You may see a "hold" on your card for the $35 but that hold will drop off and you will not have a charge processed.  Your e-visit answers were reviewed by a board certified advanced clinical practitioner to complete your personal care plan.  Thank you for using e-Visits.

## 2020-02-04 NOTE — Telephone Encounter (Signed)
Spoke with the pt and informed her Dr Hassan Rowan is not in the office at today and offered an appt with Dr Selena Batten.  Patient stated "oh ok if that's what I need to do" and hung up.  Message sent to PCP.

## 2020-02-05 ENCOUNTER — Encounter: Payer: Self-pay | Admitting: Family Medicine

## 2020-02-05 ENCOUNTER — Other Ambulatory Visit: Payer: Self-pay

## 2020-02-05 ENCOUNTER — Ambulatory Visit (INDEPENDENT_AMBULATORY_CARE_PROVIDER_SITE_OTHER): Payer: Commercial Managed Care - PPO | Admitting: Family Medicine

## 2020-02-05 VITALS — BP 130/72 | HR 86 | Temp 98.2°F | Wt 247.2 lb

## 2020-02-05 DIAGNOSIS — N390 Urinary tract infection, site not specified: Secondary | ICD-10-CM | POA: Diagnosis not present

## 2020-02-05 DIAGNOSIS — R300 Dysuria: Secondary | ICD-10-CM | POA: Diagnosis not present

## 2020-02-05 LAB — POCT URINALYSIS DIPSTICK
Bilirubin, UA: NEGATIVE
Blood, UA: NEGATIVE
Glucose, UA: POSITIVE — AB
Ketones, UA: NEGATIVE
Leukocytes, UA: NEGATIVE
Nitrite, UA: NEGATIVE
Protein, UA: NEGATIVE
Spec Grav, UA: 1.02 (ref 1.010–1.025)
Urobilinogen, UA: 0.2 E.U./dL
pH, UA: 5.5 (ref 5.0–8.0)

## 2020-02-05 MED ORDER — CIPROFLOXACIN HCL 500 MG PO TABS
500.0000 mg | ORAL_TABLET | Freq: Two times a day (BID) | ORAL | 0 refills | Status: DC
Start: 2020-02-05 — End: 2020-02-07

## 2020-02-05 NOTE — Telephone Encounter (Signed)
Message below sent via Mychart message. 

## 2020-02-05 NOTE — Telephone Encounter (Signed)
Looks like there is a message that patient sent, but I do not think it came to me regarding this situation too.  I did not originally treat her for UTI (this was an E visit), and with ongoing symptoms its best to have an in office evaluation before putting more antibiotics on board.

## 2020-02-05 NOTE — Progress Notes (Signed)
   Subjective:    Patient ID: Lindsey Simon, female    DOB: 03-26-1966, 54 y.o.   MRN: [id number]  HPI Here for a lingering UTI. About a week ago she developed urinary urgency and burning. No back pain or nausea or fever. She had an e visit on 01-27-20 and was given 7 days of Keflex. This helped the symptoms a little but they never went away, and now they are coming back. She is drinking plenty of water.    Review of Systems  Constitutional: Negative.   Respiratory: Negative.   Cardiovascular: Negative.   Genitourinary: Positive for dysuria, frequency and urgency. Negative for flank pain, hematuria and pelvic pain.       Objective:   Physical Exam Constitutional:      Appearance: Normal appearance.  Cardiovascular:     Rate and Rhythm: Normal rate and regular rhythm.     Pulses: Normal pulses.     Heart sounds: Normal heart sounds.  Pulmonary:     Effort: Pulmonary effort is normal.     Breath sounds: Normal breath sounds.  Abdominal:     General: Abdomen is flat. Bowel sounds are normal. There is no distension.     Palpations: Abdomen is soft. There is no mass.     Tenderness: There is no abdominal tenderness. There is no guarding or rebound.     Hernia: No hernia is present.  Neurological:     Mental Status: She is alert.           Assessment & Plan:  Partially treated UTI. Treat with Cipro and culture the sample. Use Azo prn. Gershon Crane, MD

## 2020-02-07 ENCOUNTER — Other Ambulatory Visit: Payer: Self-pay | Admitting: *Deleted

## 2020-02-07 LAB — URINE CULTURE
MICRO NUMBER:: 10786517
SPECIMEN QUALITY:: ADEQUATE

## 2020-02-07 MED ORDER — CEPHALEXIN 500 MG PO CAPS
500.0000 mg | ORAL_CAPSULE | Freq: Three times a day (TID) | ORAL | 0 refills | Status: AC
Start: 2020-02-07 — End: 2020-02-14

## 2020-02-18 ENCOUNTER — Encounter: Payer: Self-pay | Admitting: Family Medicine

## 2020-02-19 ENCOUNTER — Ambulatory Visit (INDEPENDENT_AMBULATORY_CARE_PROVIDER_SITE_OTHER): Payer: Commercial Managed Care - PPO | Admitting: Family Medicine

## 2020-02-19 ENCOUNTER — Encounter: Payer: Self-pay | Admitting: Family Medicine

## 2020-02-19 ENCOUNTER — Other Ambulatory Visit: Payer: Self-pay

## 2020-02-19 VITALS — BP 128/76 | HR 104 | Temp 98.2°F | Wt 248.9 lb

## 2020-02-19 DIAGNOSIS — B3731 Acute candidiasis of vulva and vagina: Secondary | ICD-10-CM

## 2020-02-19 DIAGNOSIS — B373 Candidiasis of vulva and vagina: Secondary | ICD-10-CM | POA: Diagnosis not present

## 2020-02-19 DIAGNOSIS — R399 Unspecified symptoms and signs involving the genitourinary system: Secondary | ICD-10-CM

## 2020-02-19 LAB — POCT URINALYSIS DIPSTICK
Bilirubin, UA: NEGATIVE
Glucose, UA: POSITIVE — AB
Ketones, UA: POSITIVE
Leukocytes, UA: NEGATIVE
Nitrite, UA: NEGATIVE
Protein, UA: POSITIVE — AB
Spec Grav, UA: 1.015 (ref 1.010–1.025)
Urobilinogen, UA: NEGATIVE E.U./dL — AB
pH, UA: 5 (ref 5.0–8.0)

## 2020-02-19 MED ORDER — FLUCONAZOLE 100 MG PO TABS
100.0000 mg | ORAL_TABLET | Freq: Every day | ORAL | 0 refills | Status: AC
Start: 2020-02-19 — End: ?

## 2020-02-19 NOTE — Patient Instructions (Signed)
Vaginal Yeast Infection, Adult  Vaginal yeast infection is a condition that causes vaginal discharge as well as soreness, swelling, and redness (inflammation) of the vagina. This is a common condition. Some women get this infection frequently. What are the causes? This condition is caused by a change in the normal balance of the yeast (candida) and bacteria that live in the vagina. This change causes an overgrowth of yeast, which causes the inflammation. What increases the risk? The condition is more likely to develop in women who:  Take antibiotic medicines.  Have diabetes.  Take birth control pills.  Are pregnant.  Douche often.  Have a weak body defense system (immune system).  Have been taking steroid medicines for a long time.  Frequently wear tight clothing. What are the signs or symptoms? Symptoms of this condition include:  White, thick, creamy vaginal discharge.  Swelling, itching, redness, and irritation of the vagina. The lips of the vagina (vulva) may be affected as well.  Pain or a burning feeling while urinating.  Pain during sex. How is this diagnosed? This condition is diagnosed based on:  Your medical history.  A physical exam.  A pelvic exam. Your health care provider will examine a sample of your vaginal discharge under a microscope. Your health care provider may send this sample for testing to confirm the diagnosis. How is this treated? This condition is treated with medicine. Medicines may be over-the-counter or prescription. You may be told to use one or more of the following:  Medicine that is taken by mouth (orally).  Medicine that is applied as a cream (topically).  Medicine that is inserted directly into the vagina (suppository). Follow these instructions at home:  Lifestyle  Do not have sex until your health care provider approves. Tell your sex partner that you have a yeast infection. That person should go to his or her health care  provider and ask if they should also be treated.  Do not wear tight clothes, such as pantyhose or tight pants.  Wear breathable cotton underwear. General instructions  Take or apply over-the-counter and prescription medicines only as told by your health care provider.  Eat more yogurt. This may help to keep your yeast infection from returning.  Do not use tampons until your health care provider approves.  Try taking a sitz bath to help with discomfort. This is a warm water bath that is taken while you are sitting down. The water should only come up to your hips and should cover your buttocks. Do this 3-4 times per day or as told by your health care provider.  Do not douche.  If you have diabetes, keep your blood sugar levels under control.  Keep all follow-up visits as told by your health care provider. This is important. Contact a health care provider if:  You have a fever.  Your symptoms go away and then return.  Your symptoms do not get better with treatment.  Your symptoms get worse.  You have new symptoms.  You develop blisters in or around your vagina.  You have blood coming from your vagina and it is not your menstrual period.  You develop pain in your abdomen. Summary  Vaginal yeast infection is a condition that causes discharge as well as soreness, swelling, and redness (inflammation) of the vagina.  This condition is treated with medicine. Medicines may be over-the-counter or prescription.  Take or apply over-the-counter and prescription medicines only as told by your health care provider.  Do not douche.   Do not have sex or use tampons until your health care provider approves.  Contact a health care provider if your symptoms do not get better with treatment or your symptoms go away and then return. This information is not intended to replace advice given to you by your health care provider. Make sure you discuss any questions you have with your health care  provider. Document Revised: 01/18/2019 Document Reviewed: 11/06/2017 Elsevier Patient Education  2020 [employer]  

## 2020-02-19 NOTE — Progress Notes (Signed)
Established Patient Office Visit  Subjective:  Patient ID: Lindsey Simon, female    DOB: 02/10/1966  Age: 54 y.o. MRN: [id number]  CC:  Chief Complaint  Patient presents with  . Urinary Tract Infection    got better on antibiotics but then syptoms came back after stopped taking antibiotics     HPI Lindsey Simon presents for some recurrent perineal irritation and occasional burning with urination.  Recent history is that she developed some burning with urination and had E- visit on 01/27/2020 and was prescribed Keflex for 5 days.  She states her urinary symptoms fully resolved after the antibiotic.  After stopping the antibiotic though she developed recurrent symptoms and did another E- visit on 02/04/2020 it was instructed that point to follow-up in the office for further evaluation.  She was seen here in the office on 02/05/2020 and urine dipstick at that time was positive for glucose but negative for nitrites and leukocytes.  Also negative for blood.  She was placed empirically on Cipro and urine culture sent.  Urine culture came back positive for Streptococcus agalactiae  with colony count 10-49,000.  She now presents with some pruritus in the perineum region but denies any discharge.  She has occasional burning with urination.  No fever.  No nausea or vomiting.  Still menstruates but somewhat irregularly and not monthly.  She is monogamous.  Patient does have type 2 diabetes and states that her blood sugars nonmonitored has been stable but no recent A1c.  No SGLT2 medication use.  Past Medical History:  Diagnosis Date  . Asthma   . Viral meningitis 1989    Past Surgical History:  Procedure Laterality Date  . DILATION AND CURETTAGE OF UTERUS    . TONSILLECTOMY AND ADENOIDECTOMY  1982    Family History  Adopted: Yes  Problem Relation Age of Onset  . Kidney disease Mother        when younger  . Other Father        essential tremor  . Charcot-Marie-Tooth disease Father   .  Heart disease Maternal Grandmother   . Other Maternal Grandfather 84       no significant medical issues    Social History   Socioeconomic History  . Marital status: Married    Spouse name: Not on file  . Number of children: Not on file  . Years of education: Not on file  . Highest education level: Not on file  Occupational History  . Not on file  Tobacco Use  . Smoking status: Never Smoker  . Smokeless tobacco: Never Used  Substance and Sexual Activity  . Alcohol use: Yes    Comment: social, occasional  . Drug use: Never  . Sexual activity: Yes    Partners: Male  Other Topics Concern  . Not on file  Social History Narrative  . Not on file   Social Determinants of Health   Financial Resource Strain:   . Difficulty of Paying Living Expenses:   Food Insecurity:   . Worried About Programme researcher, broadcasting/film/video in the Last Year:   . Barista in the Last Year:   Transportation Needs:   . Freight forwarder (Medical):   Marland Kitchen Lack of Transportation (Non-Medical):   Physical Activity:   . Days of Exercise per Week:   . Minutes of Exercise per Session:   Stress:   . Feeling of Stress :   Social Connections:   . Frequency of Communication  with Friends and Family:   . Frequency of Social Gatherings with Friends and Family:   . Attends Religious Services:   . Active Member of Clubs or Organizations:   . Attends Banker Meetings:   Marland Kitchen Marital Status:   Intimate Partner Violence:   . Fear of Current or Ex-Partner:   . Emotionally Abused:   Marland Kitchen Physically Abused:   . Sexually Abused:     Outpatient Medications Prior to Visit  Medication Sig Dispense Refill  . ACCU-CHEK GUIDE test strip USE TO CHECK BLOOD SUGAR TWICE DAILY 50 strip 1  . ACCU-CHEK GUIDE test strip     . albuterol (PROVENTIL HFA;VENTOLIN HFA) 108 (90 Base) MCG/ACT inhaler Inhale 2 puffs into the lungs every 6 (six) hours as needed for wheezing or shortness of breath. 1 Inhaler 0  . lisinopril  (ZESTRIL) 10 MG tablet TAKE 1 TABLET BY MOUTH EVERY DAY 90 tablet 1  . metFORMIN (GLUCOPHAGE-XR) 500 MG 24 hr tablet TAKE 1 TABLET BY MOUTH EVERY DAY WITH BREAKFAST 90 tablet 1  . Multiple Vitamin (MULTIVITAMIN) tablet Take 1 tablet by mouth daily.     No facility-administered medications prior to visit.    Allergies  Allergen Reactions  . Calamine   . Prednisone Other (See Comments)    Tachycardia    ROS Review of Systems  Constitutional: Negative for chills and fever.  Genitourinary: Positive for dysuria. Negative for pelvic pain and vaginal bleeding.      Objective:    Physical Exam Vitals reviewed.  Cardiovascular:     Rate and Rhythm: Normal rate and regular rhythm.  Pulmonary:     Effort: Pulmonary effort is normal.     Breath sounds: Normal breath sounds.  Genitourinary:    Comments: Visual pelvic inspection with nurse present reveals some diffuse erythema of the labia majora and minora.  She has some erythema extending down toward the anal region.  She has some whitish curd-like discharge. Neurological:     Mental Status: She is alert.     BP 128/76 (BP Location: Left Arm, Patient Position: Sitting, Cuff Size: Normal)   Pulse (!) 104   Temp 98.2 F (36.8 C) (Oral)   Wt 248 lb 14.4 oz (112.9 kg)   SpO2 97%   BMI 40.48 kg/m  Wt Readings from Last 3 Encounters:  02/19/20 248 lb 14.4 oz (112.9 kg)  02/05/20 247 lb 3.2 oz (112.1 kg)  12/12/18 251 lb 12.8 oz (114.2 kg)     Health Maintenance Due  Topic Date Due  . Hepatitis C Screening  Never done  . PNEUMOCOCCAL POLYSACCHARIDE VACCINE AGE 24-64 HIGH RISK  Never done  . FOOT EXAM  Never done  . COVID-19 Vaccine (1) Never done  . HIV Screening  Never done  . COLONOSCOPY  Never done  . HEMOGLOBIN A1C  04/03/2019  . OPHTHALMOLOGY EXAM  12/06/2019  . INFLUENZA VACCINE  02/02/2020    There are no preventive care reminders to display for this patient.  Lab Results  Component Value Date   TSH 2.23  10/02/2018   Lab Results  Component Value Date   WBC 6.8 10/02/2018   HGB 16.1 (H) 10/02/2018   HCT 46.8 (H) 10/02/2018   MCV 88.0 10/02/2018   PLT 213.0 10/02/2018   Lab Results  Component Value Date   NA 137 10/02/2018   K 4.0 10/02/2018   CO2 28 10/02/2018   GLUCOSE 195 (H) 10/02/2018   BUN 7 10/02/2018   CREATININE  0.66 10/02/2018   BILITOT 0.5 10/02/2018   ALKPHOS 69 10/02/2018   AST 20 10/02/2018   ALT 24 10/02/2018   PROT 7.1 10/02/2018   ALBUMIN 4.0 10/02/2018   CALCIUM 9.0 10/02/2018   GFR 94.56 10/02/2018   Lab Results  Component Value Date   CHOL 192 09/13/2018   Lab Results  Component Value Date   HDL 29 (A) 09/13/2018   Lab Results  Component Value Date   LDLCALC 92 09/13/2018   Lab Results  Component Value Date   TRIG 354 (A) 09/13/2018   No results found for: [facility] Lab Results  Component Value Date   HGBA1C 10.0 (H) 10/02/2018      Assessment & Plan:   Recurrent dysuria.  Recent culture above suspect related to contaminant with Streptococcus species and low colony count.  She has been on basically 3 separate recent courses of antibiotic and also is type II diabetic and has evidence for yeast vaginitis on exam today  -No further antibiotics at this time -Fluconazole 100 mg daily for 7 days -Follow-up with primary if not clearing with the above.  We have also strongly advised that she schedule diabetic follow-up with her primary  Meds ordered this encounter  Medications  . fluconazole (DIFLUCAN) 100 MG tablet    Sig: Take 1 tablet (100 mg total) by mouth daily.    Dispense:  7 tablet    Refill:  0    Follow-up: No follow-ups on file.    Evelena Peat, MD

## 2020-03-04 IMAGING — MG DIGITAL SCREENING BILATERAL MAMMOGRAM WITH TOMO AND CAD
8 series · 8 of 24 positions shown · non-contrast
Comparison: Previous exam(s).

CLINICAL DATA: Screening.

EXAM:
DIGITAL SCREENING BILATERAL MAMMOGRAM WITH TOMO AND CAD

[R CC synth-2D]
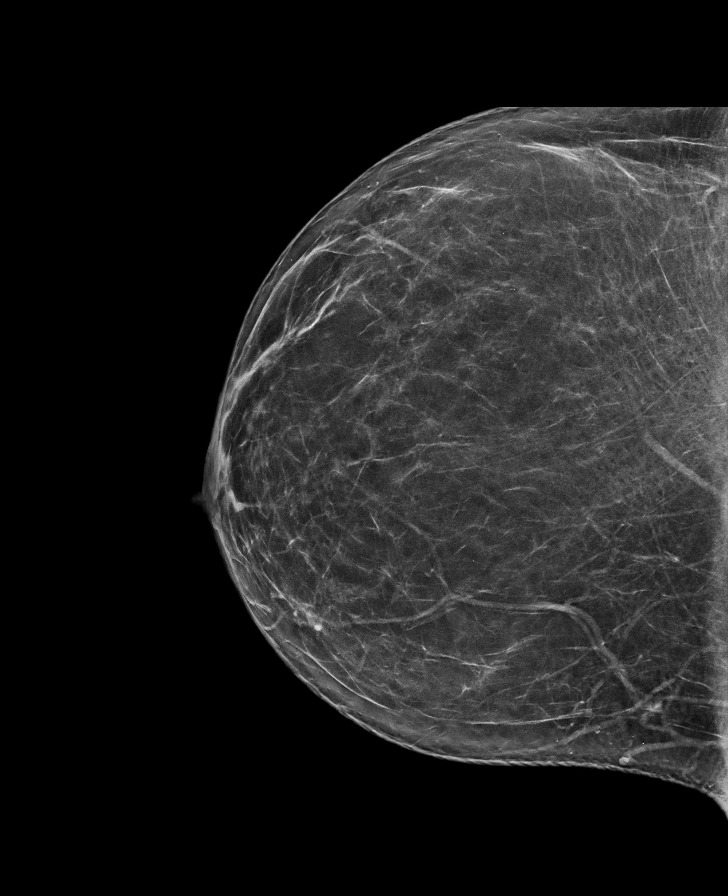

[L CC synth-2D]
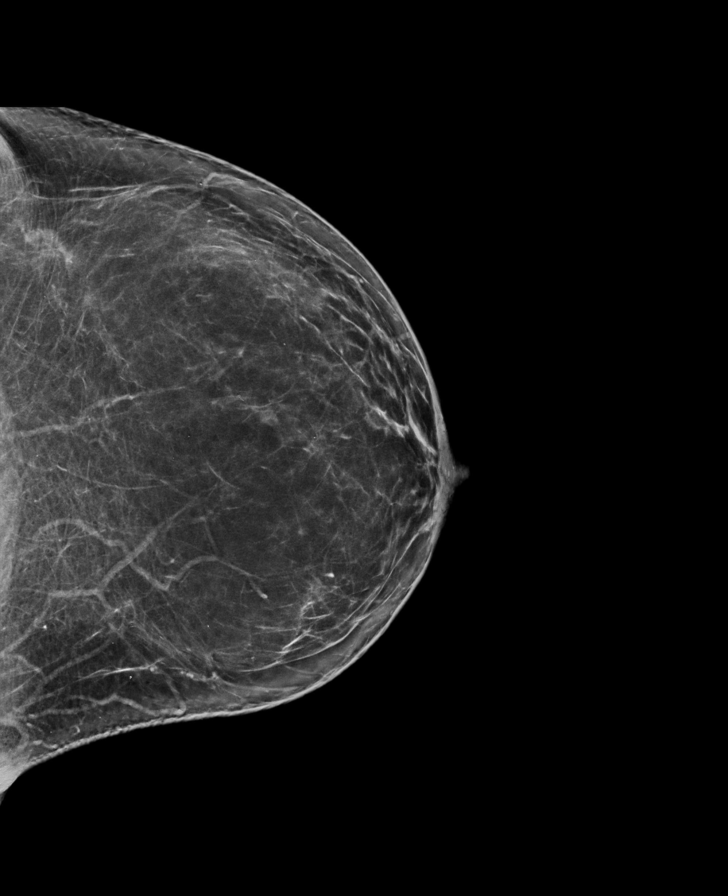

[R MLO synth-2D]
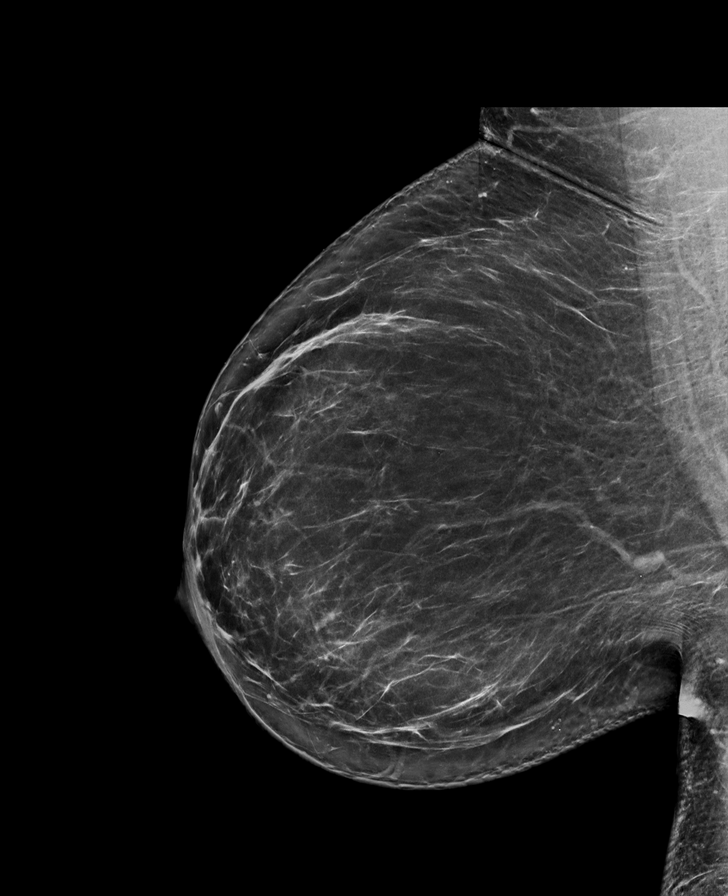

[L MLO synth-2D]
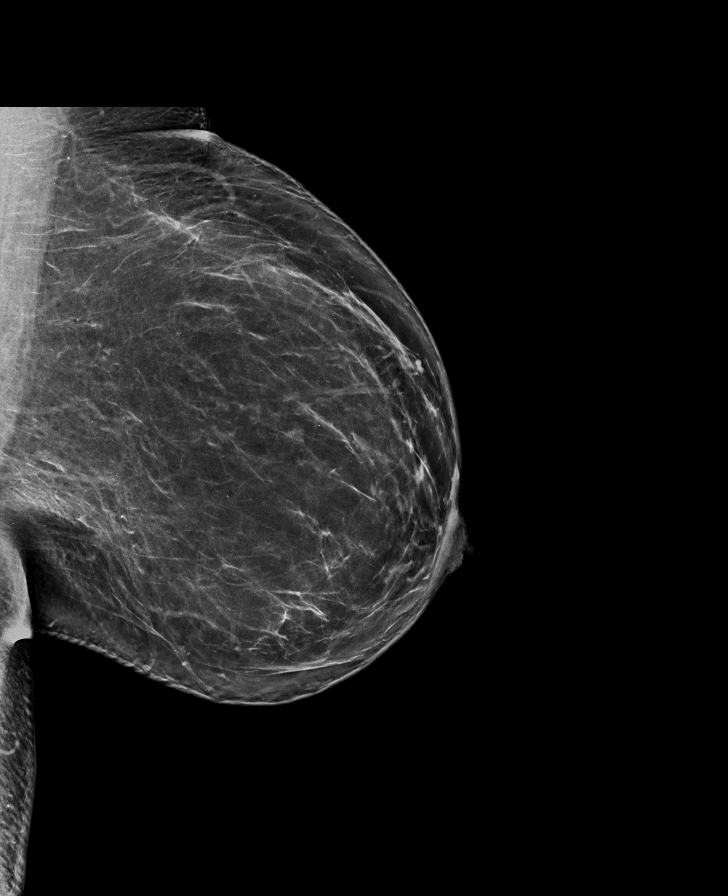

[R CC tomo · tomo slice 39/76.0]
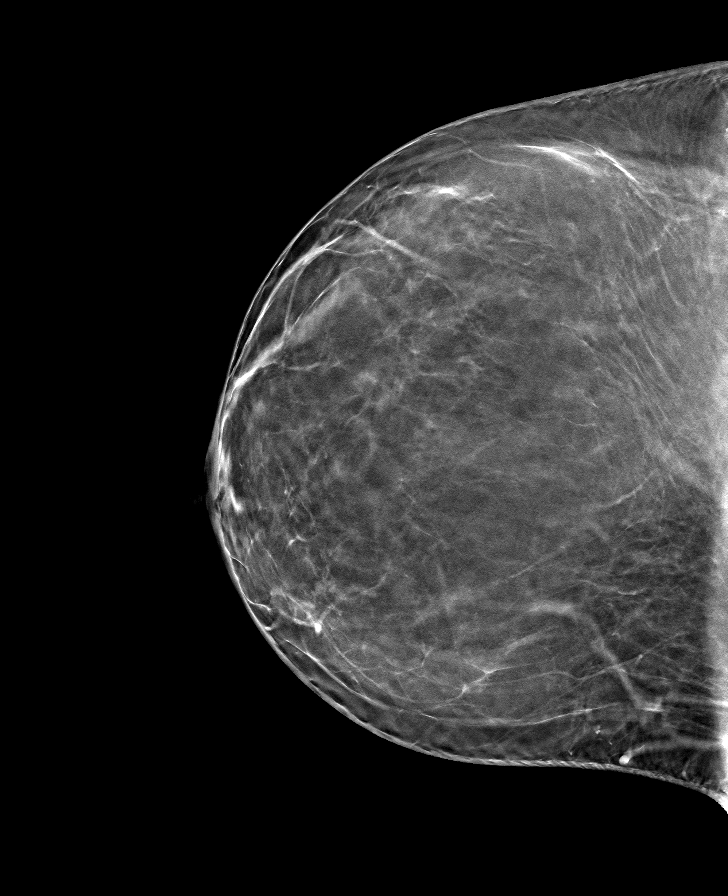

[L CC tomo · tomo slice 37/74.0]
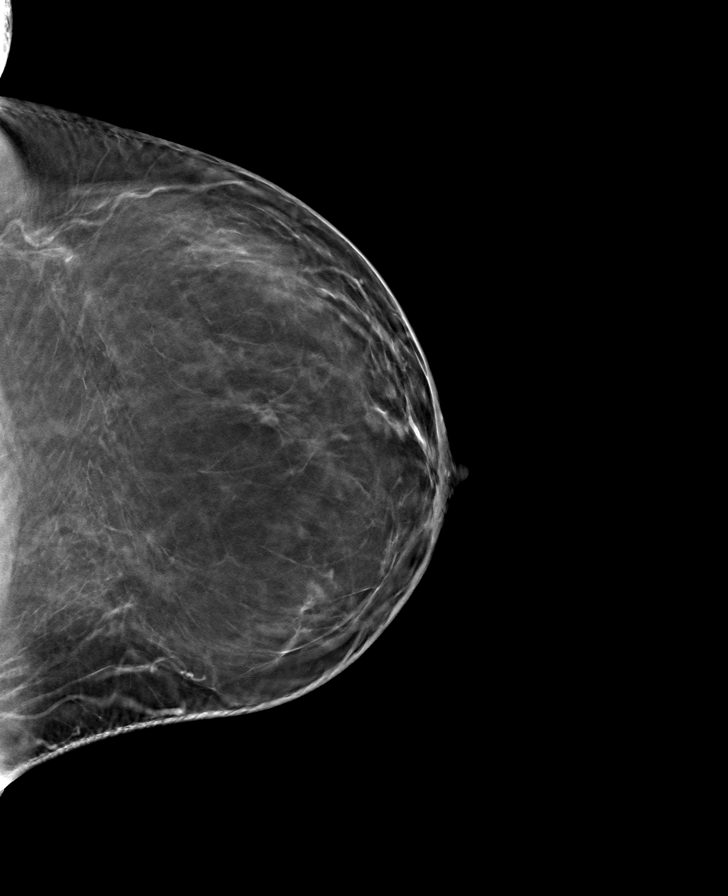

[R MLO tomo · tomo slice 45/89.0]
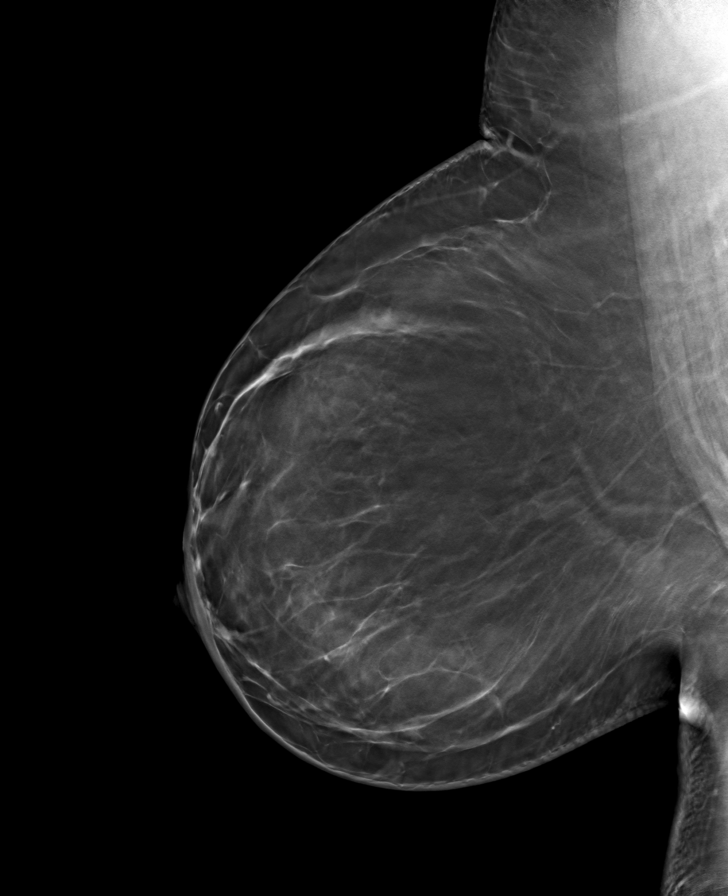

[L MLO tomo · tomo slice 43/85.0]
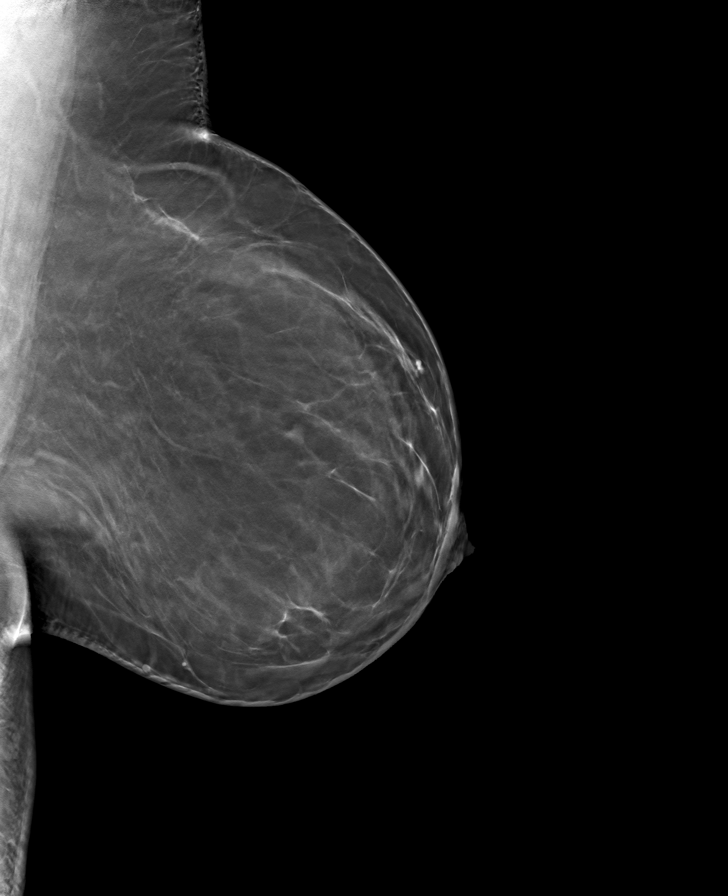

[8 of 24 positions shown; findings below may reference images not displayed]

ACR Breast Density Category b: There are scattered areas of
fibroglandular density.
FINDINGS: In the left breast, a possible mass warrants further evaluation. In
the right breast, no findings suspicious for malignancy.

Images were processed with CAD.
IMPRESSION: Further evaluation is suggested for possible mass in the left
breast.

RECOMMENDATION:
Diagnostic mammogram and possibly ultrasound of the left breast.
(Code:JC-2-SSL)

The patient will be contacted regarding the findings, and additional
imaging will be scheduled.

BI-RADS CATEGORY  0: Incomplete. Need additional imaging evaluation
and/or prior mammograms for comparison.

## 2020-03-17 ENCOUNTER — Telehealth: Payer: Self-pay | Admitting: Family Medicine

## 2020-03-17 NOTE — Telephone Encounter (Signed)
Patient called to speak with the supervisor about changing a code from diagnostic to wellness.  The DOS was 01/09/2020  Please advise

## 2020-04-07 ENCOUNTER — Telehealth: Payer: Self-pay | Admitting: Family Medicine

## 2020-04-07 NOTE — Telephone Encounter (Signed)
Patient had a mammogram and states it was coded wrong.  She states that her referral was stated wrong so now she has a 900.00 bill.  She is very frustrated and has already talked to billing with no help.  She is wanting a call back.

## 2020-04-23 NOTE — Telephone Encounter (Signed)
See 04/07/2020 note

## 2020-04-23 NOTE — Telephone Encounter (Signed)
Spoke with patient about concern of being billed twice for a diagnostic mammogram. Patient did not realize when given results from January 2021 diagnostic mammogram that it will be another follow up diagnostic mammogram in July 2021. Nurse verbalized results and recommendation to patient and its noted on the results note that patient is aware of recommendation. Patient stated if she understood she would not have gotten the diagnostic done. I will speak to Dr. Hassan Rowan about this but at this time nothing can be changed.

## 2021-06-08 ENCOUNTER — Encounter (INDEPENDENT_AMBULATORY_CARE_PROVIDER_SITE_OTHER): Payer: Self-pay

## 2021-09-21 ENCOUNTER — Other Ambulatory Visit: Payer: Self-pay | Admitting: Family Medicine

## 2021-09-21 DIAGNOSIS — Z1231 Encounter for screening mammogram for malignant neoplasm of breast: Secondary | ICD-10-CM

## 2021-09-22 ENCOUNTER — Inpatient Hospital Stay: Admission: RE | Admit: 2021-09-22 | Payer: Commercial Managed Care - PPO | Source: Ambulatory Visit

## 2021-09-24 ENCOUNTER — Other Ambulatory Visit: Payer: Self-pay | Admitting: Family Medicine

## 2021-09-24 DIAGNOSIS — N6489 Other specified disorders of breast: Secondary | ICD-10-CM

## 2021-10-20 ENCOUNTER — Other Ambulatory Visit: Payer: Self-pay | Admitting: Family Medicine

## 2021-10-20 ENCOUNTER — Ambulatory Visit
Admission: RE | Admit: 2021-10-20 | Discharge: 2021-10-20 | Disposition: A | Discharge status: Home / Self Care | Payer: Commercial Managed Care - PPO | Source: Ambulatory Visit | Attending: Family Medicine | Admitting: Family Medicine

## 2021-10-20 DIAGNOSIS — N6489 Other specified disorders of breast: Secondary | ICD-10-CM

## 2021-10-20 DIAGNOSIS — Z1239 Encounter for other screening for malignant neoplasm of breast: Secondary | ICD-10-CM

## 2022-11-10 ENCOUNTER — Ambulatory Visit
Admission: EM | Admit: 2022-11-10 | Discharge: 2022-11-10 | Disposition: A | Discharge status: Home / Self Care | Payer: Commercial Managed Care - PPO | Attending: Nurse Practitioner | Admitting: Nurse Practitioner

## 2022-11-10 DIAGNOSIS — S90464A Insect bite (nonvenomous), right lesser toe(s), initial encounter: Secondary | ICD-10-CM

## 2022-11-10 DIAGNOSIS — W57XXXA Bitten or stung by nonvenomous insect and other nonvenomous arthropods, initial encounter: Secondary | ICD-10-CM | POA: Diagnosis not present

## 2022-11-10 DIAGNOSIS — L089 Local infection of the skin and subcutaneous tissue, unspecified: Secondary | ICD-10-CM | POA: Diagnosis not present

## 2022-11-10 MED ORDER — CEPHALEXIN 500 MG PO CAPS: 500.0000 mg | ORAL_CAPSULE | Freq: Four times a day (QID) | ORAL | 0 refills | Status: AC

## 2022-11-10 NOTE — ED Provider Notes (Addendum)
[facility]    CSN: [id number] Arrival date & time: 11/10/22  1153      History   Chief Complaint Chief Complaint  Patient presents with   ant bite    HPI Lindsey Simon is a 57 y.o. female presents for evaluation of an toe infection.  Patient reports a few days ago she was bitten by fire ants on her right fifth toe.  Over the past couple days she has been having redness, swelling, warmth, pain.  Denies any drainage, fevers or chills.  She does have a history of diabetes.  No history of MRSA.  She has not used any OTC medications for symptoms.  No other concerns at this time.  HPI  Past Medical History:  Diagnosis Date   Asthma    Viral meningitis 1989    Patient Active Problem List   Diagnosis Date Noted   Hypertension 10/01/2018   Essential tremor 10/01/2018   Hyperlipidemia 10/01/2018   Diabetes mellitus type II, uncontrolled 10/01/2018   Asthma 10/01/2018    Past Surgical History:  Procedure Laterality Date   DILATION AND CURETTAGE OF UTERUS     TONSILLECTOMY AND ADENOIDECTOMY  1982    OB History   No obstetric history on file.      Home Medications    Prior to Admission medications   Medication Sig Start Date End Date Taking? Authorizing Provider  cephALEXin (KEFLEX) 500 MG capsule Take 1 capsule (500 mg total) by mouth 4 (four) times daily for 7 days. 11/10/22 11/17/22 Yes Radford Pax, NP  ACCU-CHEK GUIDE test strip USE TO CHECK BLOOD SUGAR TWICE DAILY 01/10/19   Wynn Banker, MD  ACCU-CHEK GUIDE test strip  10/28/18   [provider]  albuterol (PROVENTIL HFA;VENTOLIN HFA) 108 (90 Base) MCG/ACT inhaler Inhale 2 puffs into the lungs every 6 (six) hours as needed for wheezing or shortness of breath. 10/01/18   Wynn Banker, MD  fluconazole (DIFLUCAN) 100 MG tablet Take 1 tablet (100 mg total) by mouth daily. 02/19/20   Burchette, Elberta Fortis, MD  lisinopril (ZESTRIL) 10 MG tablet TAKE 1 TABLET BY MOUTH EVERY DAY 06/12/19    Koberlein, Jannette Spanner C, MD  metFORMIN (GLUCOPHAGE-XR) 500 MG 24 hr tablet TAKE 1 TABLET BY MOUTH EVERY DAY WITH BREAKFAST 06/12/19   Koberlein, Paris Lore, MD  Multiple Vitamin (MULTIVITAMIN) tablet Take 1 tablet by mouth daily.    [provider]    Family History Family History  Adopted: Yes  Problem Relation Age of Onset   Kidney disease Mother        when younger   Other Father        essential tremor   Charcot-Marie-Tooth disease Father    Heart disease Maternal Grandmother    Other Maternal Grandfather 39       no significant medical issues    Social History Social History   Tobacco Use   Smoking status: Never   Smokeless tobacco: Never  Substance Use Topics   Alcohol use: Yes    Comment: social, occasional   Drug use: Never     Allergies   Calamine and Prednisone   Review of Systems Review of Systems  Skin:        Right toe infection     Physical Exam Triage Vital Signs ED Triage Vitals  Enc Vitals Group     BP 11/10/22 1201 (!) 164/103     Pulse Rate 11/10/22 1200 80  Resp 11/10/22 1200 18     Temp 11/10/22 1200 98.6 F (37 C)     Temp Source 11/10/22 1200 Oral     SpO2 11/10/22 1200 95 %     Weight --      Height --      Head Circumference --      Peak Flow --      Pain Score 11/10/22 1159 0     Pain Loc --      Pain Edu? --      Excl. in GC? --    No data found.  Updated Vital Signs BP (!) 164/103   Pulse 80   Temp 98.6 F (37 C) (Oral)   Resp 18   LMP  (LMP Unknown)   SpO2 95%   Visual Acuity Right Eye Distance:   Left Eye Distance:   Bilateral Distance:    Right Eye Near:   Left Eye Near:    Bilateral Near:     Physical Exam Vitals and nursing note reviewed.  Constitutional:      Appearance: Normal appearance.  HENT:     Head: Normocephalic and atraumatic.  Eyes:     Pupils: Pupils are equal, round, and reactive to light.  Cardiovascular:     Rate and Rhythm: Normal rate.  Pulmonary:     Effort: Pulmonary  effort is normal.  Musculoskeletal:       Feet:  Skin:    General: Skin is warm and dry.  Neurological:     General: No focal deficit present.     Mental Status: She is alert and oriented to person, place, and time.  Psychiatric:        Mood and Affect: Mood normal.        Behavior: Behavior normal.      UC Treatments / Results  Labs (all labs ordered are listed, but only abnormal results are displayed) Labs Reviewed - No data to display Comprehensive Metabolic Panel Order: [id number] Component Ref Range & Units 8 mo ago  Sodium 135 - 146 MMOL/L 135  Potassium 3.5 - 5.3 MMOL/L 4.3  Comment: NO VISIBLE HEMOLYSIS  Chloride 98 - 110 MMOL/L 100  CO2 21 - 31 MMOL/L 25  BUN 8 - 24 MG/DL 11  Glucose 70 - 99 MG/DL 086 High   Creatinine 5.78 - 1.20 MG/DL 4.69  Calcium 8.5 - 62.9 MG/DL 9.1  Total Protein 6.4 - 8.9 G/DL 7.2  Albumin 3.5 - 5.7 G/DL 4.0  Total Bilirubin 0.0 - 1.0 MG/DL 0.6  Alkaline Phosphatase 34 - 104 IU/L or U/L 78  AST (SGOT) 13 - 39 IU/L or U/L 32  ALT (SGPT) 7 - 52 IU/L or U/L 42  Anion Gap 4 - 14 MMOL/L 10  Est. GFR >=60 ML/MIN/1.73 M*2 >90  Comment: GFR estimated by CKD-EPI equations(NKF 2021).  "Recommend confirmation of Cr-based eGFR by using Cys-based eGFR and other filtration markers (if applicable) in complex cases and clinical decision-making, as needed."  Resulting Agency [facility]   Specimen Collected: 02/23/22 09:53   Performed by: [place] Last Resulted: 02/23/22 20:23  Received From: [facility] visits prior to 09/03/2022.  Result Received: 11/10/22 11:53     EKG   Radiology No results found.  Procedures Procedures (including critical care time)  Medications Ordered in UC Medications - No data to display  Initial Impression / Assessment and Plan / UC Course  I have  reviewed the triage vital signs and the nursing notes.  Pertinent labs &  imaging results that were available during my care of the patient were reviewed by me and considered in my medical decision making (see chart for details).    Reviewed recent labs  Start Keflex for cellulitis of toe Warm soapy water soaks as needed PCP follow-up if symptoms do not improve ER precautions reviewed and patient verbalized understanding Final Clinical Impressions(s) / UC Diagnoses   Final diagnoses:  Infected insect bite of toe of right foot, initial encounter     Discharge Instructions      Start Keflex 4 times a day for 7 days You can do Epsom salt/warm soapy water soaks as needed Follow-up with your PCP if your symptoms do not improve Please go to the ER for any worsening symptoms   ED Prescriptions     Medication Sig Dispense Auth. Provider   cephALEXin (KEFLEX) 500 MG capsule Take 1 capsule (500 mg total) by mouth 4 (four) times daily for 7 days. 28 capsule Radford Pax, NP      PDMP not reviewed this encounter.   Radford Pax, NP 11/10/22 1210    Radford Pax, NP 11/10/22 1210

## 2022-11-10 NOTE — Discharge Instructions (Signed)
Start Keflex 4 times a day for 7 days You can do Epsom salt/warm soapy water soaks as needed Follow-up with your PCP if your symptoms do not improve Please go to the ER for any worsening symptoms

## 2022-11-10 NOTE — ED Triage Notes (Signed)
Pt presents with c/o possible infection on her small toe to the r foot. States she was bit by an ant.

## 2023-11-06 ENCOUNTER — Ambulatory Visit (HOSPITAL_BASED_OUTPATIENT_CLINIC_OR_DEPARTMENT_OTHER): Admitting: Radiology

## 2023-11-06 ENCOUNTER — Ambulatory Visit (HOSPITAL_BASED_OUTPATIENT_CLINIC_OR_DEPARTMENT_OTHER)
Admission: EM | Admit: 2023-11-06 | Discharge: 2023-11-06 | Disposition: A | Discharge status: Home / Self Care | Attending: Family Medicine | Admitting: Family Medicine

## 2023-11-06 ENCOUNTER — Encounter (HOSPITAL_BASED_OUTPATIENT_CLINIC_OR_DEPARTMENT_OTHER): Payer: Self-pay

## 2023-11-06 DIAGNOSIS — S82831A Other fracture of upper and lower end of right fibula, initial encounter for closed fracture: Secondary | ICD-10-CM | POA: Diagnosis not present

## 2023-11-06 DIAGNOSIS — S99911A Unspecified injury of right ankle, initial encounter: Secondary | ICD-10-CM

## 2023-11-06 NOTE — ED Provider Notes (Addendum)
 [facility]    CSN: [id number] Arrival date & time: 11/06/23  0935      History   Chief Complaint Chief Complaint  Patient presents with   Ankle Pain    HPI Lindsey Simon is a 58 y.o. female.   Patient is a 58 year old female who presents today with right ankle injury.  Reports she had a trip and fall yesterday.  She landed on the right foot and ankle twisting the ankle.  She has some mild right knee pain and rib pain also.  Pain with ambulation and she heard a pop.  There is swelling.  Denies any loss of sensation, numbness or tingling.   Ankle Pain   Past Medical History:  Diagnosis Date   Asthma    Viral meningitis 1989    Patient Active Problem List   Diagnosis Date Noted   Hypertension 10/01/2018   Essential tremor 10/01/2018   Hyperlipidemia 10/01/2018   Diabetes mellitus type II, uncontrolled 10/01/2018   Asthma 10/01/2018    Past Surgical History:  Procedure Laterality Date   DILATION AND CURETTAGE OF UTERUS     TONSILLECTOMY AND ADENOIDECTOMY  1982    OB History   No obstetric history on file.      Home Medications    Prior to Admission medications   Medication Sig Start Date End Date Taking? Authorizing Provider  ACCU-CHEK GUIDE test strip USE TO CHECK BLOOD SUGAR TWICE DAILY 01/10/19   Koberlein, Junell C, MD  ACCU-CHEK GUIDE test strip  10/28/18   [provider]  albuterol  (PROVENTIL  HFA;VENTOLIN  HFA) 108 (90 Base) MCG/ACT inhaler Inhale 2 puffs into the lungs every 6 (six) hours as needed for wheezing or shortness of breath. 10/01/18   Koberlein, Junell C, MD  fluconazole  (DIFLUCAN ) 100 MG tablet Take 1 tablet (100 mg total) by mouth daily. 02/19/20   Burchette, Marijean Shouts, MD  lisinopril  (ZESTRIL ) 10 MG tablet TAKE 1 TABLET BY MOUTH EVERY DAY 06/12/19   Koberlein, Junell C, MD  metFORMIN  (GLUCOPHAGE -XR) 500 MG 24 hr tablet TAKE 1 TABLET BY MOUTH EVERY DAY WITH BREAKFAST 06/12/19   Koberlein, Junell C, MD  Multiple Vitamin  (MULTIVITAMIN) tablet Take 1 tablet by mouth daily.    [provider]    Family History Family History  Problem Relation Age of Onset   Kidney disease Mother        when younger   Other Father        essential tremor   Charcot-Marie-Tooth disease Father    Heart disease Maternal Grandmother    Other Maternal Grandfather 51       no significant medical issues    Social History Social History   Tobacco Use   Smoking status: Never   Smokeless tobacco: Never  Substance Use Topics   Alcohol use: Yes    Comment: social, occasional   Drug use: Never     Allergies   Calamine and Prednisone    Review of Systems Review of Systems  See HPI Physical Exam Triage Vital Signs ED Triage Vitals  Encounter Vitals Group     BP 11/06/23 0957 (!) 172/103     Systolic BP Percentile --      Diastolic BP Percentile --      Pulse Rate 11/06/23 0957 79     Resp --      Temp 11/06/23 0957 98.1 F (36.7 C)     Temp Source 11/06/23 0957 Oral     SpO2 11/06/23 0957  97 %     Weight --      Height --      Head Circumference --      Peak Flow --      Pain Score 11/06/23 1000 5     Pain Loc --      Pain Education --      Exclude from Growth Chart --    No data found.  Updated Vital Signs BP (!) 172/103 (BP Location: Right Arm)   Pulse 79   Temp 98.1 F (36.7 C) (Oral)   SpO2 97%   Visual Acuity Right Eye Distance:   Left Eye Distance:   Bilateral Distance:    Right Eye Near:   Left Eye Near:    Bilateral Near:     Physical Exam Vitals and nursing note reviewed.  Constitutional:      General: She is not in acute distress.    Appearance: Normal appearance. She is not ill-appearing, toxic-appearing or diaphoretic.  Pulmonary:     Effort: Pulmonary effort is normal.  Musculoskeletal:        General: Swelling, tenderness and signs of injury present.       Legs:     Comments: Right lateral ankle swelling. Mild foot swelling   Limited ROM. 2+ pedal pulse,  sensation intact.  No bruising.   Skin:    General: Skin is warm and dry.  Neurological:     Mental Status: She is alert.  Psychiatric:        Mood and Affect: Mood normal.      UC Treatments / Results  Labs (all labs ordered are listed, but only abnormal results are displayed) Labs Reviewed - No data to display  EKG   Radiology DG Ankle Complete Right Result Date: 11/06/2023 CLINICAL DATA:  Trauma to the right ankle. EXAM: RIGHT ANKLE - COMPLETE 3+ VIEW COMPARISON:  None Available. FINDINGS: Mildly displaced oblique fracture of the distal fibula. There is minimal lateral subluxation of the ankle. There is soft tissue swelling of the ankle. No radiopaque foreign soft tissue gas. IMPRESSION: Mildly displaced oblique fracture of the distal fibula. Electronically Signed   By: Angus Bark M.D.   On: 11/06/2023 11:04    Procedures Procedures (including critical care time)  Medications Ordered in UC Medications - No data to display  Initial Impression / Assessment and Plan / UC Course  I have reviewed the triage vital signs and the nursing notes.  Pertinent labs & imaging results that were available during my care of the patient were reviewed by me and considered in my medical decision making (see chart for details).  Clinical Course as of 11/06/23 1113  Mon Nov 06, 2023  1109 DG Ankle Complete Right [TB]    Clinical Course User Index [TB] Jerri Morale A, FNP    Distal right fibular fracture-placed in temporary splint here for stabilization.  Crutches for nonweightbearing.  Recommended rest, ice, elevate. Ibuprofen for pain and inflammation, swelling Contact given for orthopedic follow-up for further management Final Clinical Impressions(s) / UC Diagnoses   Final diagnoses:  Injury of right ankle, initial encounter  Closed fracture of distal end of right fibula, unspecified fracture morphology, initial encounter     Discharge Instructions      You have a  fracture of the ankle. We are placing you in a splint here for stabilization.  Recommend icing the ankle for 20 minutes at a time at least a few times a day.  You can  take Tylenol/ibuprofen for pain inflammation and swelling. Crutches for no weight bearing.  Recommend follow-up with orthopedic for further management of this ankle fracture    ED Prescriptions   None    PDMP not reviewed this encounter.   Landa Pine, FNP 11/06/23 1111    Landa Pine, FNP 11/06/23 1113

## 2023-11-06 NOTE — Discharge Instructions (Signed)
 You have a fracture of the ankle. We are placing you in a splint here for stabilization.  Recommend icing the ankle for 20 minutes at a time at least a few times a day.  You can take Tylenol/ibuprofen for pain inflammation and swelling. Crutches for no weight bearing.  Recommend follow-up with orthopedic for further management of this ankle fracture

## 2023-11-06 NOTE — ED Triage Notes (Addendum)
 Trip and fall yesterday. Right ankle pain, right knee pain, right rib pain. Patient has hx of htn. Lost family doctor last year. Has not re-established herself anywhere and has not been taking her blood pressure medication. Pain to lateral aspect of ankle.
